# Patient Record
Sex: Female | Born: 1955 | Race: White | Hispanic: No | Marital: Married | Smoking: Never smoker
Health system: Southern US, Community
[De-identification: ages and names within clinical notes are randomized; demographics above are authoritative.]

## PROBLEM LIST (undated history)

## (undated) DIAGNOSIS — Z973 Presence of spectacles and contact lenses: Secondary | ICD-10-CM

## (undated) DIAGNOSIS — K219 Gastro-esophageal reflux disease without esophagitis: Secondary | ICD-10-CM

## (undated) DIAGNOSIS — N9489 Other specified conditions associated with female genital organs and menstrual cycle: Secondary | ICD-10-CM

## (undated) DIAGNOSIS — Z8759 Personal history of other complications of pregnancy, childbirth and the puerperium: Secondary | ICD-10-CM

## (undated) DIAGNOSIS — N950 Postmenopausal bleeding: Secondary | ICD-10-CM

## (undated) DIAGNOSIS — M1990 Unspecified osteoarthritis, unspecified site: Secondary | ICD-10-CM

## (undated) HISTORY — PX: LAPAROSCOPY FOR ECTOPIC PREGNANCY: SUR765

---

## 1997-11-27 ENCOUNTER — Other Ambulatory Visit: Admission: RE | Admit: 1997-11-27 | Discharge: 1997-11-27 | Payer: Self-pay | Admitting: Obstetrics and Gynecology

## 1997-12-13 ENCOUNTER — Ambulatory Visit (HOSPITAL_COMMUNITY): Admission: RE | Admit: 1997-12-13 | Discharge: 1997-12-13 | Payer: Self-pay | Admitting: Obstetrics and Gynecology

## 1999-06-05 ENCOUNTER — Other Ambulatory Visit: Admission: RE | Admit: 1999-06-05 | Discharge: 1999-06-05 | Payer: Self-pay | Admitting: Obstetrics and Gynecology

## 2000-07-20 ENCOUNTER — Other Ambulatory Visit: Admission: RE | Admit: 2000-07-20 | Discharge: 2000-07-20 | Payer: Self-pay | Admitting: Obstetrics and Gynecology

## 2001-08-16 ENCOUNTER — Other Ambulatory Visit: Admission: RE | Admit: 2001-08-16 | Discharge: 2001-08-16 | Payer: Self-pay | Admitting: Obstetrics and Gynecology

## 2001-09-20 ENCOUNTER — Encounter: Admission: RE | Admit: 2001-09-20 | Discharge: 2001-12-19 | Payer: Self-pay | Admitting: Obstetrics and Gynecology

## 2002-11-21 ENCOUNTER — Other Ambulatory Visit: Admission: RE | Admit: 2002-11-21 | Discharge: 2002-11-21 | Payer: Self-pay | Admitting: Obstetrics and Gynecology

## 2003-12-04 ENCOUNTER — Other Ambulatory Visit: Admission: RE | Admit: 2003-12-04 | Discharge: 2003-12-04 | Payer: Self-pay | Admitting: Obstetrics and Gynecology

## 2003-12-13 ENCOUNTER — Encounter: Admission: RE | Admit: 2003-12-13 | Discharge: 2003-12-13 | Payer: Self-pay | Admitting: Obstetrics and Gynecology

## 2005-01-01 ENCOUNTER — Other Ambulatory Visit: Admission: RE | Admit: 2005-01-01 | Discharge: 2005-01-01 | Payer: Self-pay | Admitting: Obstetrics and Gynecology

## 2006-02-09 ENCOUNTER — Encounter: Admission: RE | Admit: 2006-02-09 | Discharge: 2006-02-09 | Payer: Self-pay | Admitting: Obstetrics and Gynecology

## 2006-04-06 HISTORY — PX: COLONOSCOPY: SHX174

## 2007-03-02 ENCOUNTER — Encounter: Admission: RE | Admit: 2007-03-02 | Discharge: 2007-03-02 | Payer: Self-pay | Admitting: Obstetrics and Gynecology

## 2008-03-22 ENCOUNTER — Encounter: Admission: RE | Admit: 2008-03-22 | Discharge: 2008-03-22 | Payer: Self-pay | Admitting: Obstetrics and Gynecology

## 2009-03-22 ENCOUNTER — Encounter: Admission: RE | Admit: 2009-03-22 | Discharge: 2009-03-22 | Payer: Self-pay | Admitting: Obstetrics and Gynecology

## 2010-03-27 ENCOUNTER — Encounter
Admission: RE | Admit: 2010-03-27 | Discharge: 2010-03-27 | Payer: Self-pay | Source: Home / Self Care | Attending: Obstetrics and Gynecology | Admitting: Obstetrics and Gynecology

## 2011-01-09 ENCOUNTER — Other Ambulatory Visit: Payer: Self-pay | Admitting: Obstetrics and Gynecology

## 2011-01-09 DIAGNOSIS — Z1231 Encounter for screening mammogram for malignant neoplasm of breast: Secondary | ICD-10-CM

## 2011-04-02 ENCOUNTER — Ambulatory Visit
Admission: RE | Admit: 2011-04-02 | Discharge: 2011-04-02 | Disposition: A | Discharge status: Home / Self Care | Payer: BC Managed Care – PPO | Source: Ambulatory Visit | Attending: Obstetrics and Gynecology | Admitting: Obstetrics and Gynecology

## 2011-04-02 DIAGNOSIS — Z1231 Encounter for screening mammogram for malignant neoplasm of breast: Secondary | ICD-10-CM

## 2011-04-10 ENCOUNTER — Other Ambulatory Visit: Payer: Self-pay | Admitting: Obstetrics and Gynecology

## 2011-04-10 DIAGNOSIS — R928 Other abnormal and inconclusive findings on diagnostic imaging of breast: Secondary | ICD-10-CM

## 2011-04-13 ENCOUNTER — Ambulatory Visit
Admission: RE | Admit: 2011-04-13 | Discharge: 2011-04-13 | Disposition: A | Discharge status: Home / Self Care | Payer: BC Managed Care – PPO | Source: Ambulatory Visit | Attending: Obstetrics and Gynecology | Admitting: Obstetrics and Gynecology

## 2011-04-13 DIAGNOSIS — R928 Other abnormal and inconclusive findings on diagnostic imaging of breast: Secondary | ICD-10-CM

## 2011-09-16 ENCOUNTER — Other Ambulatory Visit: Payer: Self-pay | Admitting: Obstetrics and Gynecology

## 2011-09-16 DIAGNOSIS — N6009 Solitary cyst of unspecified breast: Secondary | ICD-10-CM

## 2011-10-12 ENCOUNTER — Ambulatory Visit
Admission: RE | Admit: 2011-10-12 | Discharge: 2011-10-12 | Disposition: A | Discharge status: Home / Self Care | Payer: BC Managed Care – PPO | Source: Ambulatory Visit | Attending: Obstetrics and Gynecology | Admitting: Obstetrics and Gynecology

## 2011-10-12 DIAGNOSIS — N6009 Solitary cyst of unspecified breast: Secondary | ICD-10-CM

## 2012-03-21 ENCOUNTER — Other Ambulatory Visit: Payer: Self-pay | Admitting: Obstetrics and Gynecology

## 2012-03-21 DIAGNOSIS — N6310 Unspecified lump in the right breast, unspecified quadrant: Secondary | ICD-10-CM

## 2012-03-21 DIAGNOSIS — Z09 Encounter for follow-up examination after completed treatment for conditions other than malignant neoplasm: Secondary | ICD-10-CM

## 2012-04-11 ENCOUNTER — Ambulatory Visit
Admission: RE | Admit: 2012-04-11 | Discharge: 2012-04-11 | Disposition: A | Discharge status: Home / Self Care | Payer: BC Managed Care – PPO | Source: Ambulatory Visit | Attending: Obstetrics and Gynecology | Admitting: Obstetrics and Gynecology

## 2012-04-11 DIAGNOSIS — N6310 Unspecified lump in the right breast, unspecified quadrant: Secondary | ICD-10-CM

## 2012-04-11 DIAGNOSIS — Z09 Encounter for follow-up examination after completed treatment for conditions other than malignant neoplasm: Secondary | ICD-10-CM

## 2013-03-13 ENCOUNTER — Other Ambulatory Visit: Payer: Self-pay

## 2013-03-13 DIAGNOSIS — Z1231 Encounter for screening mammogram for malignant neoplasm of breast: Secondary | ICD-10-CM

## 2013-04-03 ENCOUNTER — Other Ambulatory Visit: Payer: Self-pay | Admitting: Orthopedic Surgery

## 2013-04-10 ENCOUNTER — Encounter (HOSPITAL_COMMUNITY): Payer: Self-pay | Admitting: Pharmacy Technician

## 2013-04-11 ENCOUNTER — Other Ambulatory Visit (HOSPITAL_COMMUNITY): Payer: Self-pay | Admitting: Orthopedic Surgery

## 2013-04-11 NOTE — Patient Instructions (Addendum)
[address] Axon[address] [facility]  04/11/2013   [facility]:  [facility]19[facility]13  [facility]  [facility] at    1030   AM.  Call this number if you have problems the morning of surgery: [phone]       Remember:   Do not eat food After Midnight. Tuesday NIGHT     / MAY HAVE CLEAR LIQUIDS Monday MORNING UNTIL 0700, THEN NOTHING BY MOUTH   Take these medicines the morning of surgery with A SIP OF WATER:NONE   .  Contacts, dentures or partial plates can not be worn to surgery  Leave suitcase in the car. After surgery it may be brought to your room.  For patients admitted to the hospital, checkout time is 11:00 AM day of  discharge.             SPECIAL INSTRUCTIONS- SEE [place] PREPARING FOR SURGERY INSTRUCTION SHEET-     DO NOT WEAR JEWELRY, LOTIONS, POWDERS, OR PERFUMES.  WOMEN-- DO NOT SHAVE LEGS OR UNDERARMS FOR 12 HOURS BEFORE SHOWERS. MEN MAY SHAVE FACE.  Patients discharged the day of surgery will not be allowed to drive home. IF going home the day of surgery, you must have a driver and someone to stay with you for the first 24 hours  Name and phone number of your driver:       Husband  Dorene SorrowJerry                                                                  Please read over the following fact sheets that you were given:Incentive Spirometry Sheet                                                                                  Marqueta Pulley  PST 336  [phone]                 FAILURE TO FOLLOW THESE INSTRUCTIONS MAY RESULT IN  CANCELLATION   OF YOUR SURGERY                                                  Patient Signature _____________________________

## 2013-04-12 ENCOUNTER — Encounter (HOSPITAL_COMMUNITY): Payer: Self-pay

## 2013-04-12 ENCOUNTER — Encounter (INDEPENDENT_AMBULATORY_CARE_PROVIDER_SITE_OTHER): Payer: Self-pay

## 2013-04-12 ENCOUNTER — Encounter (HOSPITAL_COMMUNITY)
Admission: RE | Admit: 2013-04-12 | Discharge: 2013-04-12 | Disposition: A | Discharge status: Home / Self Care | Payer: BC Managed Care – PPO | Source: Ambulatory Visit | Attending: Orthopedic Surgery | Admitting: Orthopedic Surgery

## 2013-04-12 DIAGNOSIS — Z01818 Encounter for other preprocedural examination: Secondary | ICD-10-CM | POA: Insufficient documentation

## 2013-04-12 DIAGNOSIS — Z01812 Encounter for preprocedural laboratory examination: Secondary | ICD-10-CM | POA: Insufficient documentation

## 2013-04-12 HISTORY — DX: Unspecified osteoarthritis, unspecified site: M19.90

## 2013-04-12 LAB — CBC
HEMATOCRIT: 42.3 % (ref 36.0–46.0)
Hemoglobin: 14.2 g/dL (ref 12.0–15.0)
MCH: 29.5 pg (ref 26.0–34.0)
MCHC: 33.6 g/dL (ref 30.0–36.0)
MCV: 87.9 fL (ref 78.0–100.0)
Platelets: 381 10*3/uL (ref 150–400)
RBC: 4.81 MIL/uL (ref 3.87–5.11)
RDW: 13 % (ref 11.5–15.5)
WBC: 7.4 10*3/uL (ref 4.0–10.5)

## 2013-04-12 LAB — HCG, SERUM, QUALITATIVE: Preg, Serum: NEGATIVE

## 2013-04-18 ENCOUNTER — Ambulatory Visit
Admission: RE | Admit: 2013-04-18 | Discharge: 2013-04-18 | Disposition: A | Discharge status: Home / Self Care | Payer: BC Managed Care – PPO | Source: Ambulatory Visit

## 2013-04-18 DIAGNOSIS — Z1231 Encounter for screening mammogram for malignant neoplasm of breast: Secondary | ICD-10-CM

## 2013-04-19 ENCOUNTER — Encounter (HOSPITAL_COMMUNITY): Payer: BC Managed Care – PPO | Admitting: Anesthesiology

## 2013-04-19 ENCOUNTER — Encounter (HOSPITAL_COMMUNITY)
Admission: RE | Disposition: A | Discharge status: Home / Self Care | Payer: Self-pay | Source: Ambulatory Visit | Attending: Orthopedic Surgery

## 2013-04-19 ENCOUNTER — Ambulatory Visit (HOSPITAL_COMMUNITY): Payer: BC Managed Care – PPO | Admitting: Anesthesiology

## 2013-04-19 ENCOUNTER — Ambulatory Visit (HOSPITAL_COMMUNITY)
Admission: RE | Admit: 2013-04-19 | Discharge: 2013-04-19 | Disposition: A | Discharge status: Home / Self Care | Payer: BC Managed Care – PPO | Source: Ambulatory Visit | Attending: Orthopedic Surgery | Admitting: Orthopedic Surgery

## 2013-04-19 ENCOUNTER — Encounter (HOSPITAL_COMMUNITY): Payer: Self-pay | Admitting: *Deleted

## 2013-04-19 DIAGNOSIS — IMO0002 Reserved for concepts with insufficient information to code with codable children: Secondary | ICD-10-CM | POA: Insufficient documentation

## 2013-04-19 DIAGNOSIS — M2240 Chondromalacia patellae, unspecified knee: Secondary | ICD-10-CM | POA: Insufficient documentation

## 2013-04-19 DIAGNOSIS — X58XXXA Exposure to other specified factors, initial encounter: Secondary | ICD-10-CM | POA: Insufficient documentation

## 2013-04-19 DIAGNOSIS — S83249A Other tear of medial meniscus, current injury, unspecified knee, initial encounter: Secondary | ICD-10-CM | POA: Diagnosis present

## 2013-04-19 HISTORY — PX: KNEE ARTHROSCOPY: SHX127

## 2013-04-19 SURGERY — ARTHROSCOPY, KNEE
Anesthesia: General | Site: Knee | Laterality: Left

## 2013-04-19 MED ORDER — PROPOFOL 10 MG/ML IV BOLUS
INTRAVENOUS | Status: AC
  Filled 2013-04-19: qty 20

## 2013-04-19 MED ORDER — CHLORHEXIDINE GLUCONATE 4 % EX LIQD: 60.0000 mL | Freq: Once | CUTANEOUS | Status: DC

## 2013-04-19 MED ORDER — ONDANSETRON HCL 4 MG/2ML IJ SOLN
INTRAMUSCULAR | Status: AC
  Filled 2013-04-19: qty 2

## 2013-04-19 MED ORDER — MEPERIDINE HCL 50 MG/ML IJ SOLN: 6.2500 mg | INTRAMUSCULAR | Status: DC | PRN

## 2013-04-19 MED ORDER — OXYCODONE HCL 5 MG PO TABS: 5.0000 mg | ORAL_TABLET | Freq: Once | ORAL | Status: DC | PRN

## 2013-04-19 MED ORDER — PROPOFOL 10 MG/ML IV BOLUS
INTRAVENOUS | Status: DC | PRN
  Administered 2013-04-19: 160 mg via INTRAVENOUS

## 2013-04-19 MED ORDER — METHOCARBAMOL 500 MG PO TABS: 500.0000 mg | ORAL_TABLET | Freq: Four times a day (QID) | ORAL | Status: DC

## 2013-04-19 MED ORDER — CEFAZOLIN SODIUM-DEXTROSE 2-3 GM-% IV SOLR
2.0000 g | INTRAVENOUS | Status: AC
  Administered 2013-04-19: 2 g via INTRAVENOUS

## 2013-04-19 MED ORDER — FENTANYL CITRATE 0.05 MG/ML IJ SOLN
INTRAMUSCULAR | Status: AC
  Filled 2013-04-19: qty 2

## 2013-04-19 MED ORDER — ONDANSETRON HCL 4 MG/2ML IJ SOLN
INTRAMUSCULAR | Status: DC | PRN
  Administered 2013-04-19: 4 mg via INTRAVENOUS

## 2013-04-19 MED ORDER — FENTANYL CITRATE 0.05 MG/ML IJ SOLN
INTRAMUSCULAR | Status: DC | PRN
  Administered 2013-04-19: 25 ug via INTRAVENOUS
  Administered 2013-04-19 (×2): 50 ug via INTRAVENOUS
  Administered 2013-04-19: 25 ug via INTRAVENOUS

## 2013-04-19 MED ORDER — HYDROMORPHONE HCL PF 1 MG/ML IJ SOLN: 0.2500 mg | INTRAMUSCULAR | Status: DC | PRN

## 2013-04-19 MED ORDER — HYDROCODONE-ACETAMINOPHEN 5-325 MG PO TABS: 1.0000 | ORAL_TABLET | Freq: Four times a day (QID) | ORAL | Status: DC | PRN

## 2013-04-19 MED ORDER — BUPIVACAINE-EPINEPHRINE 0.25% -1:200000 IJ SOLN
INTRAMUSCULAR | Status: DC | PRN
  Administered 2013-04-19: 20 mL

## 2013-04-19 MED ORDER — ACETAMINOPHEN 10 MG/ML IV SOLN
1000.0000 mg | Freq: Once | INTRAVENOUS | Status: AC
  Administered 2013-04-19: 1000 mg via INTRAVENOUS
  Filled 2013-04-19: qty 100

## 2013-04-19 MED ORDER — LACTATED RINGERS IV SOLN
INTRAVENOUS | Status: DC | PRN
  Administered 2013-04-19: 12:00:00 via INTRAVENOUS

## 2013-04-19 MED ORDER — MIDAZOLAM HCL 5 MG/5ML IJ SOLN
INTRAMUSCULAR | Status: DC | PRN
  Administered 2013-04-19: 2 mg via INTRAVENOUS

## 2013-04-19 MED ORDER — MIDAZOLAM HCL 2 MG/2ML IJ SOLN
INTRAMUSCULAR | Status: AC
  Filled 2013-04-19: qty 2

## 2013-04-19 MED ORDER — LACTATED RINGERS IR SOLN
Status: DC | PRN
  Administered 2013-04-19: 6000 mL

## 2013-04-19 MED ORDER — DEXAMETHASONE SODIUM PHOSPHATE 10 MG/ML IJ SOLN: 10.0000 mg | Freq: Once | INTRAMUSCULAR | Status: DC

## 2013-04-19 MED ORDER — OXYCODONE HCL 5 MG/5ML PO SOLN
5.0000 mg | Freq: Once | ORAL | Status: DC | PRN
  Filled 2013-04-19: qty 5

## 2013-04-19 MED ORDER — DEXAMETHASONE SODIUM PHOSPHATE 10 MG/ML IJ SOLN
INTRAMUSCULAR | Status: DC | PRN
  Administered 2013-04-19: 10 mg via INTRAVENOUS

## 2013-04-19 MED ORDER — BUPIVACAINE-EPINEPHRINE 0.25% -1:200000 IJ SOLN
INTRAMUSCULAR | Status: AC
  Filled 2013-04-19: qty 1

## 2013-04-19 MED ORDER — CEFAZOLIN SODIUM-DEXTROSE 2-3 GM-% IV SOLR
INTRAVENOUS | Status: AC
  Filled 2013-04-19: qty 50

## 2013-04-19 MED ORDER — LIDOCAINE HCL (CARDIAC) 20 MG/ML IV SOLN
INTRAVENOUS | Status: DC | PRN
  Administered 2013-04-19: 60 mg via INTRAVENOUS

## 2013-04-19 MED ORDER — SODIUM CHLORIDE 0.9 % IV SOLN: INTRAVENOUS | Status: DC

## 2013-04-19 MED ORDER — LIDOCAINE HCL (CARDIAC) 20 MG/ML IV SOLN
INTRAVENOUS | Status: AC
  Filled 2013-04-19: qty 5

## 2013-04-19 MED ORDER — PROMETHAZINE HCL 25 MG/ML IJ SOLN: 6.2500 mg | INTRAMUSCULAR | Status: DC | PRN

## 2013-04-19 SURGICAL SUPPLY — 28 items
BANDAGE ELASTIC 6 VELCRO ST LF (GAUZE/BANDAGES/DRESSINGS) ×2 IMPLANT
BLADE 4.2CUDA (BLADE) ×3 IMPLANT
CLOTH BEACON ORANGE TIMEOUT ST (SAFETY) ×3 IMPLANT
COUNTER NEEDLE 20 DBL MAG RED (NEEDLE) ×3 IMPLANT
CUFF TOURN SGL QUICK 34 (TOURNIQUET CUFF) ×3
CUFF TRNQT CYL 34X4X40X1 (TOURNIQUET CUFF) ×1 IMPLANT
DRSG EMULSION OIL 3X3 NADH (GAUZE/BANDAGES/DRESSINGS) ×3 IMPLANT
DURAPREP 26ML APPLICATOR (WOUND CARE) ×3 IMPLANT
GLOVE BIO SURGEON STRL SZ7 (GLOVE) ×4 IMPLANT
GLOVE BIO SURGEON STRL SZ8 (GLOVE) ×3 IMPLANT
GLOVE BIOGEL PI IND STRL 7.0 (GLOVE) IMPLANT
GLOVE BIOGEL PI IND STRL 8 (GLOVE) ×1 IMPLANT
GLOVE BIOGEL PI INDICATOR 7.0 (GLOVE) ×2
GLOVE BIOGEL PI INDICATOR 8 (GLOVE) ×2
GOWN STRL REUS W/TWL LRG LVL3 (GOWN DISPOSABLE) ×5 IMPLANT
IV NS 1000ML (IV SOLUTION) ×18
IV NS 1000ML BAXH (IV SOLUTION) IMPLANT
MANIFOLD NEPTUNE II (INSTRUMENTS) ×3 IMPLANT
PACK ARTHROSCOPY WL (CUSTOM PROCEDURE TRAY) ×3 IMPLANT
PADDING CAST COTTON 6X4 STRL (CAST SUPPLIES) ×4 IMPLANT
POSITIONER SURGICAL ARM (MISCELLANEOUS) ×3 IMPLANT
SET ARTHROSCOPY TUBING (MISCELLANEOUS) ×3
SET ARTHROSCOPY TUBING LN (MISCELLANEOUS) ×1 IMPLANT
SPONGE GAUZE 4X4 12PLY (GAUZE/BANDAGES/DRESSINGS) ×2 IMPLANT
SUT ETHILON 4 0 PS 2 18 (SUTURE) ×3 IMPLANT
TOWEL OR 17X26 10 PK STRL BLUE (TOWEL DISPOSABLE) ×3 IMPLANT
WAND 90 DEG TURBOVAC W/CORD (SURGICAL WAND) ×4 IMPLANT
WRAP KNEE MAXI GEL POST OP (GAUZE/BANDAGES/DRESSINGS) ×3 IMPLANT

## 2013-04-19 NOTE — H&P (Signed)
  CC- Brittney Hatfield is a 58 y.o. female who presents with left knee pain.  HPI- . Knee Pain: Patient presents with knee pain involving the  left knee. Onset of the symptoms was several months ago. Inciting event: increased pain after speed walking. Current symptoms include giving out, pain located medially and stiffness. Pain is aggravated by lateral movements, pivoting, rising after sitting and squatting.  Patient has had no prior knee problems. Evaluation to date: MRI: abnormal medial meniscal tear. Treatment to date: rest and cortisone injection which did not provide significant relief.  Past Medical History  Diagnosis Date  . Arthritis     Past Surgical History  Procedure Laterality Date  . Cesarean section    . Colonoscopy      Prior to Admission medications   Medication Sig Start Date End Date Taking? Authorizing Provider  B Complex-C (B-COMPLEX WITH VITAMIN C) tablet Take 1 tablet by mouth daily.    Historical Provider, MD  Biotin 5000 MCG CAPS Take 1 capsule by mouth daily.    Historical Provider, MD  calcium-vitamin D (OSCAL WITH D) 500-200 MG-UNIT per tablet Take 1 tablet by mouth daily with breakfast.    Historical Provider, MD  cholecalciferol (VITAMIN D) 1000 UNITS tablet Take 1,000 Units by mouth daily.    Historical Provider, MD  GLUCOSAMINE HCL PO Take 2,000 mg by mouth daily.    Historical Provider, MD  Multiple Vitamin (MULTIVITAMIN WITH MINERALS) TABS tablet Take 1 tablet by mouth daily.    Historical Provider, MD  Omega-3 Fatty Acids (OMEGA 3 PO) Take 1 capsule by mouth daily.    Historical Provider, MD  OVER THE COUNTER MEDICATION Take 1 tablet by mouth daily. Tumeric    Historical Provider, MD  vitamin C (ASCORBIC ACID) 500 MG tablet Take 500 mg by mouth daily.    Historical Provider, MD   Knee Exam antalgic gait, soft tissue tenderness over medial joint line, no effusion, negative pivot-shift, collateral ligaments intact  Physical Examination: General appearance  - alert, well appearing, and in no distress Mental status - alert, oriented to person, place, and time Chest - clear to auscultation, no wheezes, rales or rhonchi, symmetric air entry Heart - normal rate, regular rhythm, normal S1, S2, no murmurs, rubs, clicks or gallops Abdomen - soft, nontender, nondistended, no masses or organomegaly Neurological - alert, oriented, normal speech, no focal findings or movement disorder noted   Asessment/Plan--- Left knee medial meniscal tear- - Plan left knee arthroscopy with meniscal debridement. Procedure risks and potential comps discussed with patient who elects to proceed. Goals are decreased pain and increased function with a high likelihood of achieving both

## 2013-04-19 NOTE — Discharge Instructions (Signed)
Arthroscopic Procedure, Knee °An arthroscopic procedure can find what is wrong with your knee. °PROCEDURE °Arthroscopy is a surgical technique that allows your orthopedic surgeon to diagnose and treat your knee injury with accuracy. They will look into your knee through a small instrument. This is almost like a small (pencil sized) telescope. Because arthroscopy affects your knee less than open knee surgery, you can anticipate a more rapid recovery. Taking an active role by following your caregiver's instructions will help with rapid and complete recovery. Use crutches, rest, elevation, ice, and knee exercises as instructed. The length of recovery depends on various factors including type of injury, age, physical condition, medical conditions, and your rehabilitation. °Your knee is the joint between the large bones (femur and tibia) in your leg. Cartilage covers these bone ends which are smooth and slippery and allow your knee to bend and move smoothly. Two menisci, thick, semi-lunar shaped pads of cartilage which form a rim inside the joint, help absorb shock and stabilize your knee. Ligaments bind the bones together and support your knee joint. Muscles move the joint, help support your knee, and take stress off the joint itself. Because of this all programs and physical therapy to rehabilitate an injured or repaired knee require rebuilding and strengthening your muscles. °AFTER THE PROCEDURE °· After the procedure, you will be moved to a recovery area until most of the effects of the medication have worn off. Your caregiver will discuss the test results with you.  °· Only take over-the-counter or prescription medicines for pain, discomfort, or fever as directed by your caregiver.  °SEEK MEDICAL CARE IF:  °· You have increased bleeding from your wounds.  °· You see redness, swelling, or have increasing pain in your wounds.  °· You have pus coming from your wound.  °· You have an oral temperature above 102° F (38.9°  C).  °· You notice a bad smell coming from the wound or dressing.  °· You have severe pain with any motion of your knee.  °SEEK IMMEDIATE MEDICAL CARE IF:  °· You develop a rash.  °· You have difficulty breathing.  °· You have any allergic problems.  °FURTHER INSTRUCTIONS: °· You may start showering two days after being discharged home but do not submerge the incisions under water.  °· Change dressing 48 hours after the procedure and then cover the small incisions with band aids until your follow up visit. °· Avoid periods of inactivity such as sitting longer than an hour when not asleep. This helps prevent blood clots.  °· You may put full weight on your legs and walk as much as is comfortable.  °· Do not drive while taking narcotics.  °Wear the elastic stockings for three weeks following surgery during the day but you may remove then at night. °· Make sure you keep all of your appointments after your operation with all of your doctors and caregivers. You should call the office at [phone] and make an appointment for approximately one week after the date of your surgery. °· Please pick up a stool softener and laxative for home use as long as you are requiring pain medications. °· Continue to use ice on the knee for pain and swelling from surgery. You may notice swelling that will progress down to the foot and ankle.  This is normal after surgery.  Elevate the leg when you are not up walking on it.   °RANGE OF MOTION AND STRENGTHENING EXERCISES  °Rehabilitation of the knee is   important following a knee injury or an operation. After just a few days of immobilization, the muscles of the thigh which control the knee become weakened and shrink (atrophy). Knee exercises are designed to build up the tone and strength of the thigh muscles and to improve knee motion. Often times heat used for twenty to thirty minutes before working out will loosen up your tissues and help with improving the range of motion but do not  use heat for the first two weeks following surgery. These exercises can be done on a training (exercise) mat, on the floor, on a table or on a bed. Use what ever works the best and is most comfortable for you Knee exercises include: ° ° ° ° ° ° °QUAD STRENGTHENING EXERCISES °Strengthening Quadriceps Sets ° °Tighten muscles on top of thigh by pushing knees down into floor or table. °Hold for 20 seconds. Repeat 10 times. °Do 2 sessions per day. ° ° ° °Strengthening Terminal Knee Extension ° °With knee bent over bolster, straighten knee by tightening muscle on top of thigh. Be sure to keep bottom of knee on bolster. °Hold for 20 seconds. Repeat 10 times. °Do 2 sessions per day. ° ° °Straight Leg with Bent Knee ° °Lie on back with opposite leg bent. Keep involved knee slightly bent at knee and raise leg 4-6". Hold for 10 seconds. °Repeat 20 times per set. °Do 2 sets per session. °Do 2 sessions per day. ° °

## 2013-04-19 NOTE — Transfer of Care (Signed)
Immediate Anesthesia Transfer of Care Note  Patient: Brittney PufferDiana R Hatfield  Procedure(s) Performed: Procedure(s): LEFT KNEE ARTHROSCOPY, meniscal debridement, chondroplasty (Left)  Patient Location: PACU  Anesthesia Type:General  Level of Consciousness: awake, alert  and oriented  Airway & Oxygen Therapy: Patient Spontanous Breathing and Patient connected to face mask oxygen  Post-op Assessment: Report given to PACU RN and Post -op Vital signs reviewed and stable  Post vital signs: Reviewed and stable  Complications: No apparent anesthesia complications

## 2013-04-19 NOTE — Anesthesia Postprocedure Evaluation (Signed)
  Anesthesia Post-op Note  Patient: Brittney PufferDiana R Hatfield  Procedure(s) Performed: Procedure(s) (LRB): LEFT KNEE ARTHROSCOPY, meniscal debridement, chondroplasty (Left)  Patient Location: PACU  Anesthesia Type: General  Level of Consciousness: awake and alert   Airway and Oxygen Therapy: Patient Spontanous Breathing  Post-op Pain: mild  Post-op Assessment: Post-op Vital signs reviewed, Patient's Cardiovascular Status Stable, Respiratory Function Stable, Patent Airway and No signs of Nausea or vomiting  Last Vitals:  Filed Vitals:   04/19/13 1529  BP: 114/72  Pulse: 84  Temp: 36.1 C  Resp: 20    Post-op Vital Signs: stable   Complications: No apparent anesthesia complications

## 2013-04-19 NOTE — Op Note (Signed)
Preoperative diagnosis-  Left knee medial meniscal tear  Postoperative diagnosis Left- knee medial meniscal tear  Plus Left medial femoral chondral defect  Procedure- Left knee arthroscopy with medial meniscal debridement and chondroplasty   Surgeon- Gus RankinFrank V. Linken Mcglothen, MD  Anesthesia-General  EBL-  minimal Complications- None  Condition- PACU - hemodynamically stable.  Brief clinical note- -Brittney Hatfield is a 58 y.o.  female with a several month history of left knee pain and mechanical symptoms. Exam and history suggested medial meniscal tear confirmed by MRI. The patient presents now for arthroscopy and debridement   Procedure in detail -       After successful administration of General anesthetic, a tourmiquet is placed high on the Left  thigh and the Left lower extremity is prepped and draped in the usual sterile fashion. Time out is performed by the surgical team. Standard superomedial and inferolateral portal sites are marked and incisions made with an 11 blade. The inflow cannula is passed through the superomedial portal and camera through the inferolateral portal and inflow is initiated. Arthroscopic visualization proceeds.      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 The medial and lateral gutters are visualized and there are  no loose bodies. Flexion and valgus force is applied to the knee and the medial compartment is entered. A spinal needle is passed into the joint through the site marked for the inferomedial portal. A small incision is made and the dilator passed into the joint. The findings for the medial compartment are tear of posterior horn medial meniscus and grade II and II chondromalacia medial femoral condyle and 1 x 1 cm Grade IV area medial tibial plateau. The tear is debrided to a stable base with baskets and a shaver and  sealed off with the Arthrocare. The shaver is used to debride the unstable cartilage on the medial femoral condyle to a stable cartilaginous  base with stable edges. It is probed and found to be stable.    The intercondylar notch is visualized and the ACL appears normal. The lateral compartment is entered and the findings are normal .       The joint is again inspected and there are no other tears, defects or loose bodies identified. The arthroscopic equipment is then removed from the inferior portals which are closed with interrupted 4-0 nylon. 20 ml of .25% Marcaine with epinephrine are injected through the inflow cannula and the cannula is then removed and the portal closed with nylon. The incisions are cleaned and dried and a bulky sterile dressing is applied. The patient is then awakened and transported to recovery in stable condition.   04/19/2013, 1:50 PM

## 2013-04-19 NOTE — Preoperative (Signed)
Beta Blockers   Reason not to administer Beta Blockers:Not Applicable 

## 2013-04-19 NOTE — Anesthesia Preprocedure Evaluation (Addendum)
Anesthesia Evaluation  Patient identified by MRN, date of birth, ID band Patient awake    Reviewed: Allergy & Precautions, H&P , NPO status , Patient's Chart, lab work & pertinent test results  Airway Mallampati: II TM Distance: >3 FB Neck ROM: Full    Dental no notable dental hx.    Pulmonary neg pulmonary ROS,  breath sounds clear to auscultation  Pulmonary exam normal       Cardiovascular negative cardio ROS  Rhythm:Regular Rate:Normal     Neuro/Psych negative neurological ROS  negative psych ROS   GI/Hepatic negative GI ROS, Neg liver ROS,   Endo/Other  negative endocrine ROS  Renal/GU negative Renal ROS     Musculoskeletal negative musculoskeletal ROS (+)   Abdominal   Peds  Hematology negative hematology ROS (+)   Anesthesia Other Findings   Reproductive/Obstetrics negative OB ROS                           Anesthesia Physical Anesthesia Plan  ASA: II  Anesthesia Plan: General   Post-op Pain Management:    Induction: Intravenous  Airway Management Planned: LMA  Additional Equipment:   Intra-op Plan:   Post-operative Plan: Extubation in OR  Informed Consent: I have reviewed the patients History and Physical, chart, labs and discussed the procedure including the risks, benefits and alternatives for the proposed anesthesia with the patient or authorized representative who has indicated his/her understanding and acceptance.   Dental advisory given  Plan Discussed with: CRNA  Anesthesia Plan Comments:         Anesthesia Quick Evaluation

## 2013-04-20 ENCOUNTER — Encounter (HOSPITAL_COMMUNITY): Payer: Self-pay | Admitting: Orthopedic Surgery

## 2014-03-14 ENCOUNTER — Other Ambulatory Visit: Payer: Self-pay

## 2014-03-14 DIAGNOSIS — Z1231 Encounter for screening mammogram for malignant neoplasm of breast: Secondary | ICD-10-CM

## 2014-04-19 ENCOUNTER — Ambulatory Visit
Admission: RE | Admit: 2014-04-19 | Discharge: 2014-04-19 | Disposition: A | Discharge status: Home / Self Care | Payer: BC Managed Care – PPO | Source: Ambulatory Visit

## 2014-04-19 DIAGNOSIS — Z1231 Encounter for screening mammogram for malignant neoplasm of breast: Secondary | ICD-10-CM

## 2014-07-25 ENCOUNTER — Ambulatory Visit (INDEPENDENT_AMBULATORY_CARE_PROVIDER_SITE_OTHER): Payer: BLUE CROSS/BLUE SHIELD | Admitting: Podiatrist

## 2014-07-25 ENCOUNTER — Encounter: Payer: Self-pay | Admitting: Podiatrist

## 2014-07-25 ENCOUNTER — Ambulatory Visit (INDEPENDENT_AMBULATORY_CARE_PROVIDER_SITE_OTHER): Payer: BLUE CROSS/BLUE SHIELD

## 2014-07-25 VITALS — BP 135/73 | HR 68 | Resp 11 | Ht 64.0 in | Wt 180.0 lb

## 2014-07-25 DIAGNOSIS — M2040 Other hammer toe(s) (acquired), unspecified foot: Secondary | ICD-10-CM

## 2014-07-25 DIAGNOSIS — M79676 Pain in unspecified toe(s): Secondary | ICD-10-CM | POA: Diagnosis not present

## 2014-07-25 DIAGNOSIS — M2010 Hallux valgus (acquired), unspecified foot: Secondary | ICD-10-CM | POA: Diagnosis not present

## 2014-07-25 DIAGNOSIS — M216X9 Other acquired deformities of unspecified foot: Secondary | ICD-10-CM | POA: Diagnosis not present

## 2014-07-25 NOTE — Patient Instructions (Signed)
Hammer Toes Hammer toes is a condition in which one or more of your toes is permanently flexed. CAUSES  This happens when a muscle imbalance or abnormal bone length makes your small toes buckle. This causes the toe joint to contract and the strong cord-like bands that attach muscles to the bones (tendons) in your toes to shorten.  SIGNS AND SYMPTOMS  Common symptoms of flexible hammer toes include:   A buildup of skin cells (corns). Corns occur where boney bumps come in frequent contact with hard surfaces. For example, where your shoes press and rub.  Irritation.  Inflammation.  Pain.  Limited motion in your toes. DIAGNOSIS  Hammer toes are diagnosed through a physical exam of your toes. During the exam, your health care provider may try to reproduce your symptoms by manipulating your foot. Often, X-ray exams are done to determine the degree of deformity and to make sure that the cause is not a fracture.  TREATMENT  Hammer toes can be treated with corrective surgery. There are several types of surgical procedures that can treat hammer toes. The most common procedures include:  Arthroplasty--A portion of the joint is surgically removed and your toe is straightened. The gap fills in with fibrous tissue. This procedure helps treat pain and deformity and helps restore function.  Fusion--Cartilage between the two bones of the affected joint is taken out and the bones fuse together into one longer bone. This helps keep your toe stable and reduces pain but leaves your toe stiff, yet straight.  Implantation--A portion of your bone is removed and replaced with an implant to restore motion.  Flexor tendon transfers--This procedure repositions the tendons that curl the toes down (flexor tendons). This may be done to release the deforming force that causes your toe to buckle. Several of these procedures require fixing your toe with a pin that is visible at the tip of your toe. The pin keeps the toe  straight during healing. Your health care provider will remove the pin usually within 4-8 weeks after the procedure.  Document Released: 03/20/2000 Document Revised: 03/28/2013 Document Reviewed: 11/28/2012 [facility] Patient Information 2015 [place], [place]. This information is not intended to replace advice given to you by your health care provider. Make sure you discuss any questions you have with your health care provider.   Bunionectomy A bunionectomy is surgery to remove a bunion. A bunion is an enlargement of the joint at the base of the big toe. It is made up of bone and soft tissue on the inside part of the joint. Over time, a painful lump appears on the inside of the joint. The big toe begins to point inward toward the second toe. New bone growth can occur and a bone spur may form. The pain eventually causes difficulty walking. A bunion usually results from inflammation caused by the irritation of poorly fitting shoes. It often begins later in life. A bunionectomy is performed when nonsurgical treatment no longer works. When surgery is needed, the extent of the procedure will depend on the degree of deformity of the foot. Your surgeon will discuss with you the different procedures and what will work best for you depending on your age and health. LET YOUR CAREGIVER KNOW ABOUT:   Previous problems with anesthetics or medicines used to numb the skin.  Allergies to dyes, iodine, foods, and/or latex.  Medicines taken including herbs, eye drops, prescription medicines (especially medicines used to "thin the blood"), aspirin and other over-the-counter medicines, and steroids (by mouth  or as a cream).  History of bleeding or blood problems.  Possibility of pregnancy, if this applies.  History of blood clots in your legs and/or lungs .  Previous surgery.  Other important health problems. RISKS AND COMPLICATIONS   Infection.  Pain.  Nerve damage.  Possibility that the bunion will  recur. BEFORE THE PROCEDURE  You should be present 60 minutes prior to your procedure or as directed.  PROCEDURE  Surgery is often done so that you can go home the same day (outpatient). It may be done in a hospital or in an outpatient surgical center. An anesthetic will be used to help you sleep during the procedure. Sometimes, a spinal anesthetic is used to make you numb below the waist. A cut (incision) is made over the swollen area at the first joint of the big toe. The enlarged lump will be removed. If there is a need to reposition the bones of the big toe, this may require more than 1 incision. The bone itself may need to be cut. Screws and wires may be used in the repair. These can be removed at a later date. In severe cases, the entire joint may need to be removed and a joint replacement inserted. When done, the incision is closed with stitches (sutures). Skin adhesive strips may be added for reinforcement. They help hold the incision closed.  AFTER THE PROCEDURE  Compression bandages (dressings) are then wrapped around the wound. This helps to keep the foot in alignment and reduce swelling. Your foot will be monitored for bleeding and swelling. You will need to stay for a few hours in the recovery area before being discharged. This allows time for the anesthesia to wear off. You will be discharged home when you are awake, stable, and doing well. HOME CARE INSTRUCTIONS   You can expect to return to normal activities within 6 to 8 weeks after surgery. The foot is at increased risk for swelling for several months. When you can expect to bear weight on the operated foot will depend on the extent of your surgery. The milder the deformity, the less tissue is removed and the sooner the return to normal activity level. During the recovery period, a special shoe, boot, or cast may be worn to accommodate the surgical bandage and to help provide stability to the foot.  Once you are home, an ice pack applied  to the operative site may help with discomfort and keep swelling down. Stop using the ice if it causes discomfort.  Keep your feet raised (elevated) when possible to lessen swelling.  If you have an elastic bandage on your foot and you have numbness, tingling, or your foot becomes cold and blue, adjust the bandage to make it comfortable.  Change dressings as directed.  Keep the wound dry and clean. The wound may be washed gently with soap and water. Gently blot dry without rubbing. Do not take baths or use swimming pools or hot tubs for 10 days, or as instructed by your caregiver.  Only take over-the-counter or prescription medicines for pain, discomfort, or fever as directed by your caregiver.  You may continue a normal diet as directed.  For activity, use crutches with no weight bearing or your orthopedic shoe as directed. Continue to use crutches or a cane as directed until you can stand without causing pain. SEEK MEDICAL CARE IF:   You have redness, swelling, bruising, or increasing pain in the wound.  There is pus coming from the wound.  You have drainage from a wound lasting longer than 1 day.  You have an oral temperature above 102 F (38.9 C).  You notice a bad smell coming from the wound or dressing.  The wound breaks open after sutures have been removed.  You develop dizzy episodes or fainting while standing.  You have persistent nausea or vomiting.  Your toes become cold.  Pain is not relieved with medicines. SEEK IMMEDIATE MEDICAL CARE IF:   You develop a rash.  You have difficulty breathing.  You develop any reaction or side effects to medicines given.  Your toes are numb or blue, or you have severe pain. MAKE SURE YOU:   Understand these instructions.  Will watch your condition.  Will get help right away if you are not doing well or get worse. Document Released: 03/06/2005 Document Revised: 06/15/2011 Document Reviewed: 04/11/2007 [facility] Patient  Information 2015 [place], [place]. This information is not intended to replace advice given to you by your health care provider. Make sure you discuss any questions you have with your health care provider.

## 2014-07-25 NOTE — Progress Notes (Signed)
   Subjective:    Patient ID: Brittney Hatfield, female    DOB: 08-21-1955, 59 y.o.   MRN: [id number]  HPI    Review of Systems  Musculoskeletal: Positive for gait problem.       Pt states she currently has bursitis in her right hip.  All other systems reviewed and are negative.      Objective:   Physical Exam Patient is awake, alert, and oriented x 3.  In no acute distress.  Vascular status is intact with palpable pedal pulses at 2/4 DP and PT bilateral and capillary refill time within normal limits. Neurological sensation is also intact bilaterally via Semmes Weinstein monofilament at 5/5 sites. Light touch, vibratory sensation, Achilles tendon reflex is intact. Dermatological exam reveals skin color, turger and texture as normal. No open lesions present.  Musculature intact with dorsiflexion, plantarflexion, inversion, eversion.  Significant hallux abductovalgus deformity with lateral deviation of the hallux is seen bilateral. Dislocation of the second metatarsophalangeal joint with contracture hammertoe deformity and long second metatarsal is also seen right. Hallux abductovalgus deformity with elongated second metatarsal and Mild hammertoe contractures is also seen left. X-rays show consistent findings with the clinical evaluation. No sign of fracture or dislocation seen. Patient relates some discomfort on the lateral aspect of the right foot at the fifth metatarsal head however this is likely due to the width of the foot due to the bunion prominence. She relates minimal pain to either foot.     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    Plan: Discussed conservative versus surgical treatments. Discussed wider shoes and sandals etc. Also discussed surgical options including a bunionectomy with shortening second metatarsal osteotomy in hammertoe repair. Discussed the aftercare portion and discussed that she would need to at least take a week off of work. 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 Also discussed that likely she would need pin on the second toe that would stay in for 5 weeks and then she'll have to consider this for driving. The patient and her husband will consider their options and will call if interested in surgery.

## 2015-03-07 ENCOUNTER — Other Ambulatory Visit: Payer: Self-pay

## 2015-03-07 DIAGNOSIS — Z1231 Encounter for screening mammogram for malignant neoplasm of breast: Secondary | ICD-10-CM

## 2015-04-22 ENCOUNTER — Ambulatory Visit: Payer: BLUE CROSS/BLUE SHIELD

## 2015-05-13 ENCOUNTER — Ambulatory Visit
Admission: RE | Admit: 2015-05-13 | Discharge: 2015-05-13 | Disposition: A | Discharge status: Home / Self Care | Payer: BLUE CROSS/BLUE SHIELD | Source: Ambulatory Visit

## 2015-05-13 DIAGNOSIS — Z1231 Encounter for screening mammogram for malignant neoplasm of breast: Secondary | ICD-10-CM

## 2015-12-25 DIAGNOSIS — L84 Corns and callosities: Secondary | ICD-10-CM | POA: Diagnosis not present

## 2015-12-25 DIAGNOSIS — D1801 Hemangioma of skin and subcutaneous tissue: Secondary | ICD-10-CM | POA: Diagnosis not present

## 2015-12-25 DIAGNOSIS — D2261 Melanocytic nevi of right upper limb, including shoulder: Secondary | ICD-10-CM | POA: Diagnosis not present

## 2015-12-25 DIAGNOSIS — L814 Other melanin hyperpigmentation: Secondary | ICD-10-CM | POA: Diagnosis not present

## 2015-12-25 DIAGNOSIS — D2262 Melanocytic nevi of left upper limb, including shoulder: Secondary | ICD-10-CM | POA: Diagnosis not present

## 2016-01-14 DIAGNOSIS — M859 Disorder of bone density and structure, unspecified: Secondary | ICD-10-CM | POA: Diagnosis not present

## 2016-01-14 DIAGNOSIS — Z0000 Encounter for general adult medical examination without abnormal findings: Secondary | ICD-10-CM | POA: Diagnosis not present

## 2016-01-21 DIAGNOSIS — M8580 Other specified disorders of bone density and structure, unspecified site: Secondary | ICD-10-CM | POA: Diagnosis not present

## 2016-01-21 DIAGNOSIS — Z0000 Encounter for general adult medical examination without abnormal findings: Secondary | ICD-10-CM | POA: Diagnosis not present

## 2016-01-21 DIAGNOSIS — K219 Gastro-esophageal reflux disease without esophagitis: Secondary | ICD-10-CM | POA: Diagnosis not present

## 2016-01-21 DIAGNOSIS — E784 Other hyperlipidemia: Secondary | ICD-10-CM | POA: Diagnosis not present

## 2016-01-21 DIAGNOSIS — E669 Obesity, unspecified: Secondary | ICD-10-CM | POA: Diagnosis not present

## 2016-01-21 DIAGNOSIS — Z1389 Encounter for screening for other disorder: Secondary | ICD-10-CM | POA: Diagnosis not present

## 2016-01-24 DIAGNOSIS — Z1212 Encounter for screening for malignant neoplasm of rectum: Secondary | ICD-10-CM | POA: Diagnosis not present

## 2016-05-04 ENCOUNTER — Other Ambulatory Visit: Payer: Self-pay | Admitting: Obstetrics and Gynecology

## 2016-05-04 DIAGNOSIS — Z1231 Encounter for screening mammogram for malignant neoplasm of breast: Secondary | ICD-10-CM

## 2016-05-19 DIAGNOSIS — H2513 Age-related nuclear cataract, bilateral: Secondary | ICD-10-CM | POA: Diagnosis not present

## 2016-06-09 ENCOUNTER — Ambulatory Visit
Admission: RE | Admit: 2016-06-09 | Discharge: 2016-06-09 | Disposition: A | Discharge status: Home / Self Care | Payer: BLUE CROSS/BLUE SHIELD | Source: Ambulatory Visit | Attending: Obstetrics and Gynecology | Admitting: Obstetrics and Gynecology

## 2016-06-09 DIAGNOSIS — Z1231 Encounter for screening mammogram for malignant neoplasm of breast: Secondary | ICD-10-CM | POA: Diagnosis not present

## 2016-06-10 DIAGNOSIS — Z01419 Encounter for gynecological examination (general) (routine) without abnormal findings: Secondary | ICD-10-CM | POA: Diagnosis not present

## 2016-06-10 DIAGNOSIS — Z6834 Body mass index (BMI) 34.0-34.9, adult: Secondary | ICD-10-CM | POA: Diagnosis not present

## 2016-06-17 DIAGNOSIS — Z13820 Encounter for screening for osteoporosis: Secondary | ICD-10-CM | POA: Diagnosis not present

## 2016-06-17 DIAGNOSIS — N950 Postmenopausal bleeding: Secondary | ICD-10-CM | POA: Diagnosis not present

## 2016-06-26 NOTE — H&P (Signed)
Patient name Brittney Hatfield, Brittney Hatfield DICTATION# [id number] CSN# [id number]  [facility],Roman Dubuc S, MD 06/26/2016 8:36 AM

## 2016-06-26 NOTE — H&P (Signed)
Brittney Hatfield:  Hatfield, Brittney                ACCOUNT NO.:  [id number]  MEDICAL RECORD NO.:  [id number]  LOCATION:                                 FACILITY:  PHYSICIAN:  Brittney Hatfield, M.D.        DATE OF BIRTH:  DATE OF ADMISSION: DATE OF DISCHARGE:                             HISTORY & PHYSICAL   DATE OF SURGERY:  July 13, 2016.  [facility]  HISTORY OF PRESENT ILLNESS:  The patient is a 61 year old, gravida 4, para 2 postmenopausal patient, who presents for hysteroscopy.  The patient is postmenopausal.  She did have an episode of bleeding in January, lasted for 10 days.  Subsequent saline infusion ultrasound here in the office revealed either a 3.7 cm intrauterine fibroid and/or polyp.  The patient now presents for hysteroscopic resection.  ALLERGIES:  No known drug allergies.  MEDICATIONS:  She is on Mylicon, low-dose aspirin, and vitamin replacements.  PAST MEDICAL HISTORY:  She has had a history of mild diverticulosis. History of benign hematuria.  She is being followed for hyperlipidemia. She has borderline cardiomegaly on chest x-ray.  EKG was normal.  PAST SURGICAL HISTORY:  She has had 2 cesarean sections, bilateral tubal ligation.  Also has had knee surgery.  FAMILY HISTORY:  Father had a history of alcoholism and heart disease. Mother with history of mild CVA and breast cancer.  She also had hypertension.  She had 2 sisters who are alive and well.  SOCIAL HISTORY:  No alcohol or tobacco use.  REVIEW OF SYSTEMS:  Noncontributory.  PHYSICAL EXAMINATION:  VITAL SIGNS:  The patient is afebrile.  Stable vital signs. HEENT:  The patient is normocephalic.  Pupils are equal, round, and reactive to light and accommodation.  Extraocular movements were intact. Sclerae and conjunctivae clear.  Oropharynx clear. NECK:  Without thyromegaly. BREASTS:  Not examined. LUNGS:  Clear. CARDIOVASCULAR:  Regular rhythm and rate without  murmurs, rubs, or gallops.  There was no carotid or abdominal bruits. ABDOMEN:  Benign. PELVIC:  Normal external genitalia.  Vaginal mucosa is clear.  Cervix unremarkable in size.  Uterus normal in size, shape, and contour. Adnexa free of masses or tenderness. EXTREMITIES:  Trace edema. NEUROLOGIC:  Grossly within normal limits.  IMPRESSION:  Postmenopausal bleeding with intrauterine process either fibroid versus polyp.  PLAN:  The patient will undergo hysteroscopy with MyoSure resection. The risks have been discussed including risk of infection.  The risk of hemorrhage, could require transfusion with risk of AIDS or hepatitis. Excessive bleeding could require hysterectomy.  There was also the risk of perforation with injury to adjacent organs requiring exploratory surgery.  Risk of deep venous thrombosis and pulmonary embolus.  The patient expressed understanding of indications and risks.     Brittney Hatfield, M.D.     JSM/MEDQ  D:  06/26/2016  T:  06/26/2016  Job:  [id number]

## 2016-07-01 ENCOUNTER — Encounter (HOSPITAL_BASED_OUTPATIENT_CLINIC_OR_DEPARTMENT_OTHER): Payer: Self-pay | Admitting: *Deleted

## 2016-07-01 NOTE — Progress Notes (Signed)
NPO AFTER MN.  ARRIVE AT 0600.  GETTING CBC DONE Thursday 07-02-2016.  MAY TAKE TYLENOL AM DOS W/ SIPS OF WATER.  VERBALIZED UNDERSTANDING TO STOP ASA 5 DAYS PRIOR TO DOS AND STOP FISH OIL.

## 2016-07-02 ENCOUNTER — Other Ambulatory Visit (HOSPITAL_COMMUNITY): Payer: Self-pay | Admitting: Obstetrics and Gynecology

## 2016-07-02 ENCOUNTER — Other Ambulatory Visit (HOSPITAL_COMMUNITY)
Admit: 2016-07-02 | Discharge: 2016-07-02 | Disposition: A | Discharge status: Home / Self Care | Payer: BLUE CROSS/BLUE SHIELD | Source: Ambulatory Visit | Attending: Obstetrics and Gynecology | Admitting: Obstetrics and Gynecology

## 2016-07-02 DIAGNOSIS — N924 Excessive bleeding in the premenopausal period: Secondary | ICD-10-CM | POA: Diagnosis not present

## 2016-07-02 LAB — CBC
HEMATOCRIT: 41.6 % (ref 36.0–46.0)
HEMOGLOBIN: 14.2 g/dL (ref 12.0–15.0)
MCH: 29.6 pg (ref 26.0–34.0)
MCHC: 34.1 g/dL (ref 30.0–36.0)
MCV: 86.8 fL (ref 78.0–100.0)
Platelets: 390 10*3/uL (ref 150–400)
RBC: 4.79 MIL/uL (ref 3.87–5.11)
RDW: 13.2 % (ref 11.5–15.5)
WBC: 10.3 10*3/uL (ref 4.0–10.5)

## 2016-07-13 ENCOUNTER — Ambulatory Visit (HOSPITAL_BASED_OUTPATIENT_CLINIC_OR_DEPARTMENT_OTHER): Payer: BLUE CROSS/BLUE SHIELD | Admitting: Anesthesiology

## 2016-07-13 ENCOUNTER — Ambulatory Visit (HOSPITAL_BASED_OUTPATIENT_CLINIC_OR_DEPARTMENT_OTHER)
Admission: RE | Admit: 2016-07-13 | Discharge: 2016-07-13 | Disposition: A | Discharge status: Home / Self Care | Payer: BLUE CROSS/BLUE SHIELD | Source: Ambulatory Visit | Attending: Obstetrics and Gynecology | Admitting: Obstetrics and Gynecology

## 2016-07-13 ENCOUNTER — Encounter (HOSPITAL_BASED_OUTPATIENT_CLINIC_OR_DEPARTMENT_OTHER): Payer: Self-pay

## 2016-07-13 ENCOUNTER — Encounter (HOSPITAL_BASED_OUTPATIENT_CLINIC_OR_DEPARTMENT_OTHER)
Admission: RE | Disposition: A | Discharge status: Home / Self Care | Payer: Self-pay | Source: Ambulatory Visit | Attending: Obstetrics and Gynecology

## 2016-07-13 DIAGNOSIS — N840 Polyp of corpus uteri: Secondary | ICD-10-CM | POA: Diagnosis not present

## 2016-07-13 DIAGNOSIS — N950 Postmenopausal bleeding: Secondary | ICD-10-CM | POA: Diagnosis not present

## 2016-07-13 DIAGNOSIS — D259 Leiomyoma of uterus, unspecified: Secondary | ICD-10-CM | POA: Diagnosis not present

## 2016-07-13 DIAGNOSIS — S83249A Other tear of medial meniscus, current injury, unspecified knee, initial encounter: Secondary | ICD-10-CM | POA: Diagnosis not present

## 2016-07-13 DIAGNOSIS — N849 Polyp of female genital tract, unspecified: Secondary | ICD-10-CM | POA: Diagnosis not present

## 2016-07-13 HISTORY — DX: Other specified conditions associated with female genital organs and menstrual cycle: N94.89

## 2016-07-13 HISTORY — DX: Gastro-esophageal reflux disease without esophagitis: K21.9

## 2016-07-13 HISTORY — DX: Personal history of other complications of pregnancy, childbirth and the puerperium: Z87.59

## 2016-07-13 HISTORY — DX: Presence of spectacles and contact lenses: Z97.3

## 2016-07-13 HISTORY — DX: Postmenopausal bleeding: N95.0

## 2016-07-13 HISTORY — PX: DILATATION & CURETTAGE/HYSTEROSCOPY WITH MYOSURE: SHX6511

## 2016-07-13 SURGERY — DILATATION & CURETTAGE/HYSTEROSCOPY WITH MYOSURE
Anesthesia: General

## 2016-07-13 MED ORDER — FENTANYL CITRATE (PF) 100 MCG/2ML IJ SOLN
INTRAMUSCULAR | Status: DC | PRN
  Administered 2016-07-13 (×4): 25 ug via INTRAVENOUS

## 2016-07-13 MED ORDER — PROPOFOL 10 MG/ML IV BOLUS
INTRAVENOUS | Status: DC | PRN
  Administered 2016-07-13: 200 mg via INTRAVENOUS

## 2016-07-13 MED ORDER — SILVER NITRATE-POT NITRATE 75-25 % EX MISC
CUTANEOUS | Status: AC
  Filled 2016-07-13: qty 1

## 2016-07-13 MED ORDER — ACETAMINOPHEN 325 MG PO TABS
325.0000 mg | ORAL_TABLET | ORAL | Status: DC | PRN
  Filled 2016-07-13: qty 2

## 2016-07-13 MED ORDER — FENTANYL CITRATE (PF) 100 MCG/2ML IJ SOLN
INTRAMUSCULAR | Status: AC
  Filled 2016-07-13: qty 2

## 2016-07-13 MED ORDER — MEPERIDINE HCL 25 MG/ML IJ SOLN
6.2500 mg | INTRAMUSCULAR | Status: DC | PRN
  Filled 2016-07-13: qty 1

## 2016-07-13 MED ORDER — MIDAZOLAM HCL 2 MG/2ML IJ SOLN
INTRAMUSCULAR | Status: AC
Start: 2016-07-13 — End: 2016-07-13
  Filled 2016-07-13: qty 2

## 2016-07-13 MED ORDER — LIDOCAINE 2% (20 MG/ML) 5 ML SYRINGE
INTRAMUSCULAR | Status: AC
  Filled 2016-07-13: qty 5

## 2016-07-13 MED ORDER — MIDAZOLAM HCL 5 MG/5ML IJ SOLN
INTRAMUSCULAR | Status: DC | PRN
  Administered 2016-07-13: 2 mg via INTRAVENOUS

## 2016-07-13 MED ORDER — FENTANYL CITRATE (PF) 100 MCG/2ML IJ SOLN
25.0000 ug | INTRAMUSCULAR | Status: DC | PRN
  Filled 2016-07-13: qty 1

## 2016-07-13 MED ORDER — METOCLOPRAMIDE HCL 5 MG/ML IJ SOLN
INTRAMUSCULAR | Status: DC | PRN
  Administered 2016-07-13: 10 mg via INTRAVENOUS

## 2016-07-13 MED ORDER — LIDOCAINE HCL (CARDIAC) 20 MG/ML IV SOLN
INTRAVENOUS | Status: DC | PRN
  Administered 2016-07-13: 100 mg via INTRAVENOUS

## 2016-07-13 MED ORDER — OXYCODONE HCL 5 MG/5ML PO SOLN
5.0000 mg | Freq: Once | ORAL | Status: DC | PRN
  Filled 2016-07-13: qty 5

## 2016-07-13 MED ORDER — DEXAMETHASONE SODIUM PHOSPHATE 10 MG/ML IJ SOLN
INTRAMUSCULAR | Status: AC
  Filled 2016-07-13: qty 1

## 2016-07-13 MED ORDER — KETOROLAC TROMETHAMINE 30 MG/ML IJ SOLN
INTRAMUSCULAR | Status: DC | PRN
  Administered 2016-07-13: 30 mg via INTRAVENOUS

## 2016-07-13 MED ORDER — ONDANSETRON HCL 4 MG/2ML IJ SOLN
INTRAMUSCULAR | Status: AC
  Filled 2016-07-13: qty 2

## 2016-07-13 MED ORDER — LIDOCAINE HCL 1 % IJ SOLN
INTRAMUSCULAR | Status: AC
  Filled 2016-07-13: qty 20

## 2016-07-13 MED ORDER — METOCLOPRAMIDE HCL 5 MG/ML IJ SOLN
INTRAMUSCULAR | Status: AC
  Filled 2016-07-13: qty 2

## 2016-07-13 MED ORDER — ONDANSETRON HCL 4 MG/2ML IJ SOLN
4.0000 mg | Freq: Once | INTRAMUSCULAR | Status: DC | PRN
  Filled 2016-07-13: qty 2

## 2016-07-13 MED ORDER — ONDANSETRON HCL 4 MG/2ML IJ SOLN
INTRAMUSCULAR | Status: DC | PRN
  Administered 2016-07-13: 4 mg via INTRAVENOUS

## 2016-07-13 MED ORDER — SODIUM CHLORIDE 0.9 % IJ SOLN
INTRAMUSCULAR | Status: AC
  Filled 2016-07-13: qty 50

## 2016-07-13 MED ORDER — CEFAZOLIN SODIUM-DEXTROSE 2-4 GM/100ML-% IV SOLN
INTRAVENOUS | Status: AC
  Filled 2016-07-13: qty 100

## 2016-07-13 MED ORDER — CEFAZOLIN SODIUM-DEXTROSE 2-4 GM/100ML-% IV SOLN
2.0000 g | INTRAVENOUS | Status: AC
  Administered 2016-07-13: 2 g via INTRAVENOUS
  Filled 2016-07-13: qty 100

## 2016-07-13 MED ORDER — PROPOFOL 10 MG/ML IV BOLUS
INTRAVENOUS | Status: AC
  Filled 2016-07-13: qty 40

## 2016-07-13 MED ORDER — OXYCODONE-ACETAMINOPHEN 7.5-325 MG PO TABS: 1.0000 | ORAL_TABLET | ORAL | 0 refills | Status: AC | PRN

## 2016-07-13 MED ORDER — KETOROLAC TROMETHAMINE 30 MG/ML IJ SOLN
30.0000 mg | Freq: Once | INTRAMUSCULAR | Status: DC | PRN
  Filled 2016-07-13: qty 1

## 2016-07-13 MED ORDER — OXYCODONE HCL 5 MG PO TABS
5.0000 mg | ORAL_TABLET | Freq: Once | ORAL | Status: DC | PRN
  Filled 2016-07-13: qty 1

## 2016-07-13 MED ORDER — LACTATED RINGERS IV SOLN
INTRAVENOUS | Status: DC
  Administered 2016-07-13 (×2): via INTRAVENOUS
  Filled 2016-07-13: qty 1000

## 2016-07-13 MED ORDER — DEXAMETHASONE SODIUM PHOSPHATE 4 MG/ML IJ SOLN
INTRAMUSCULAR | Status: DC | PRN
  Administered 2016-07-13: 10 mg via INTRAVENOUS

## 2016-07-13 MED ORDER — ACETAMINOPHEN 160 MG/5ML PO SOLN
325.0000 mg | ORAL | Status: DC | PRN
  Filled 2016-07-13: qty 20.3

## 2016-07-13 SURGICAL SUPPLY — 33 items
CANISTER SUCT 3000ML PPV (MISCELLANEOUS) ×3 IMPLANT
CATH ROBINSON RED A/P 16FR (CATHETERS) ×2 IMPLANT
COVER BACK TABLE 60X90IN (DRAPES) ×3 IMPLANT
DEVICE MYOSURE LITE (MISCELLANEOUS) IMPLANT
DEVICE MYOSURE REACH (MISCELLANEOUS) ×2 IMPLANT
DILATOR CANAL MILEX (MISCELLANEOUS) IMPLANT
DRAPE LG THREE QUARTER DISP (DRAPES) ×3 IMPLANT
DRSG TELFA 3X8 NADH (GAUZE/BANDAGES/DRESSINGS) ×3 IMPLANT
ELECT REM PT RETURN 9FT ADLT (ELECTROSURGICAL) ×3
ELECTRODE REM PT RTRN 9FT ADLT (ELECTROSURGICAL) ×1 IMPLANT
FILTER ARTHROSCOPY CONVERTOR (FILTER) ×3 IMPLANT
GLOVE BIO SURGEON STRL SZ7 (GLOVE) ×6 IMPLANT
GOWN STRL REUS W/ TWL LRG LVL3 (GOWN DISPOSABLE) ×2 IMPLANT
GOWN STRL REUS W/TWL LRG LVL3 (GOWN DISPOSABLE) ×6
IV NS IRRIG 3000ML ARTHROMATIC (IV SOLUTION) ×5 IMPLANT
KIT RM TURNOVER CYSTO AR (KITS) ×3 IMPLANT
LEGGING LITHOTOMY PAIR STRL (DRAPES) ×3 IMPLANT
MYOSURE XL FIBROID REM (MISCELLANEOUS)
NDL SPNL 18GX3.5 QUINCKE PK (NEEDLE) ×1 IMPLANT
NEEDLE SPNL 18GX3.5 QUINCKE PK (NEEDLE) ×3 IMPLANT
PACK BASIN DAY SURGERY FS (CUSTOM PROCEDURE TRAY) ×3 IMPLANT
PAD DRESSING TELFA 3X8 NADH (GAUZE/BANDAGES/DRESSINGS) ×1 IMPLANT
PAD OB MATERNITY 4.3X12.25 (PERSONAL CARE ITEMS) ×3 IMPLANT
PAD PREP 24X48 CUFFED NSTRL (MISCELLANEOUS) ×3 IMPLANT
SEAL ROD LENS SCOPE MYOSURE (ABLATOR) ×3 IMPLANT
SYR CONTROL 10ML LL (SYRINGE) ×3 IMPLANT
SYSTEM TISS REMOVAL MYSR XL RM (MISCELLANEOUS) IMPLANT
TOWEL OR 17X24 6PK STRL BLUE (TOWEL DISPOSABLE) ×6 IMPLANT
TUBE CONNECTING 12'X1/4 (SUCTIONS)
TUBE CONNECTING 12X1/4 (SUCTIONS) IMPLANT
TUBING AQUILEX INFLOW (TUBING) ×3 IMPLANT
TUBING AQUILEX OUTFLOW (TUBING) ×3 IMPLANT
WATER STERILE IRR 500ML POUR (IV SOLUTION) ×3 IMPLANT

## 2016-07-13 NOTE — H&P (Signed)
  History and physical exam unchanged 

## 2016-07-13 NOTE — Transfer of Care (Signed)
Immediate Anesthesia Transfer of Care Note  Patient: Brittney Hatfield  Procedure(s) Performed: Procedure(s) (LRB): DILATATION & CURETTAGE/HYSTEROSCOPY WITH MYOSURE (N/A)  Patient Location: PACU  Anesthesia Type: General  Level of Consciousness: awake, sedated, patient cooperative and responds to stimulation  Airway & Oxygen Therapy: Patient Spontanous Breathing and Patient connected to [place] O2  Post-op Assessment: Report given to PACU RN, Post -op Vital signs reviewed and stable and Patient moving all extremities  Post vital signs: Reviewed and stable  Complications: No apparent anesthesia complications

## 2016-07-13 NOTE — Anesthesia Preprocedure Evaluation (Signed)
Anesthesia Evaluation  Patient identified by MRN, date of birth, ID band Patient awake    Reviewed: Allergy & Precautions, H&P , NPO status , Patient's Chart, lab work & pertinent test results  Airway Mallampati: II  TM Distance: >3 FB Neck ROM: Full    Dental no notable dental hx.    Pulmonary neg pulmonary ROS,    Pulmonary exam normal breath sounds clear to auscultation       Cardiovascular negative cardio ROS Normal cardiovascular exam Rhythm:Regular Rate:Normal     Neuro/Psych negative neurological ROS  negative psych ROS   GI/Hepatic Neg liver ROS,   Endo/Other  negative endocrine ROS  Renal/GU negative Renal ROS     Musculoskeletal   Abdominal (+) + obese,   Peds  Hematology negative hematology ROS (+)   Anesthesia Other Findings   Reproductive/Obstetrics negative OB ROS                             Anesthesia Physical  Anesthesia Plan  ASA: II  Anesthesia Plan: General   Post-op Pain Management:    Induction: Intravenous  Airway Management Planned: LMA  Additional Equipment:   Intra-op Plan:   Post-operative Plan: Extubation in OR  Informed Consent: I have reviewed the patients History and Physical, chart, labs and discussed the procedure including the risks, benefits and alternatives for the proposed anesthesia with the patient or authorized representative who has indicated his/her understanding and acceptance.     Plan Discussed with: CRNA and Surgeon  Anesthesia Plan Comments:         Anesthesia Quick Evaluation

## 2016-07-13 NOTE — Op Note (Signed)
NAME:  Brittney Hatfield, Brittney Hatfield NO.:  [id number]  MEDICAL RECORD NO.:  [id number]  LOCATION:                                 FACILITY:  PHYSICIAN:  Juluis Mire, M.D.        DATE OF BIRTH:  DATE OF PROCEDURE:  07/13/2016 DATE OF DISCHARGE:                              OPERATIVE REPORT   PREOPERATIVE DIAGNOSIS:  Endometrial polyp.  POSTOPERATIVE DIAGNOSIS:  Endometrial polyp.  OPERATIVE PROCEDURE:  Cervical dilation, hysteroscopy with resection of endometrial polyp along with endometrial curettings.  SURGEON:  Juluis Mire, M.D.  ANESTHESIA:  General.  ESTIMATED BLOOD LOSS:  100 mL.  PACKS:  None.  DRAINS:  None.  INTRAOPERATIVE BLOOD PLACED:  None.  COMPLICATIONS:  None.  INDICATION:  Dictated in history and physical.  PROCEDURE IN DETAIL:  The patient was taken to the OR, placed in supine position.  After satisfactory level of general anesthesia obtained, the patient has been placed in the dorsal lithotomy position.  Perineum and vagina were prepped out with Betadine and draped in sterile field. Speculum was placed in vaginal vault.  Cervix was grasped with a single- tooth tenaculum.  Uterus sounded to approximately 9 cm.  Cervix was serially dilated to a size 23 Pratt dilator.  Hysteroscope was introduced.  The intrauterine cavity was distended using saline. Visualization did reveal an intrauterine polyp.  We brought in the MyoSure.  We were able to resect part of the polyp __________ became obscured from blood and __________ with the scope.  At this point in time, we removed the scope, changed it out, but we used a Randall stone forceps.  I felt like we retrieved the majority of the polyp with that. We then repeated the hysteroscopy and I could see no further evidence of the polyp.  This was all sent for Pathology.  We then obtained endometrial curettings.  We had fairly good hemostasis at that point. Total deficit was 500 mL.  At this point in  time single-tooth tenaculum was inspected and then removed.  We observed for any heavy bleeding.  There was none.  The patient was taken out of the dorsal lithotomy position.  Once alert and extubated, transferred to recovery room in good condition.  Sponge, instrument, and needle counts were correct by circulating nurse x2.     Juluis Mire, M.D.     JSM/MEDQ  D:  07/13/2016  T:  07/13/2016  Job:  [id number]

## 2016-07-13 NOTE — Brief Op Note (Signed)
07/13/2016  8:15 AM  PATIENT:  Brittney Hatfield  61 y.o. female  PRE-OPERATIVE DIAGNOSIS:  polyp vs fibroid  POST-OPERATIVE DIAGNOSIS:  * No post-op diagnosis entered *  PROCEDURE:  Procedure(s): DILATATION & CURETTAGE/HYSTEROSCOPY WITH MYOSURE (N/A)  SURGEON:  Surgeon(s) and Role:    * Richardean Chimera, MD - Primary  PHYSICIAN ASSISTANT:   ASSISTANTS: none   ANESTHESIA:   general  EBL:  Total I/O In: 200 [I.V.:200] Out: -   BLOOD ADMINISTERED:none  DRAINS: none   LOCAL MEDICATIONS USED:  NONE  SPECIMEN:  Source of Specimen:  endometrial polyp and curretting  DISPOSITION OF SPECIMEN:  PATHOLOGY  COUNTS:  YES  TOURNIQUET:  * No tourniquets in log *  DICTATION: .Other Dictation: Dictation Number [id number]  PLAN OF CARE: Discharge to home after PACU  PATIENT DISPOSITION:  PACU - hemodynamically stable.   Delay start of Pharmacological VTE agent (>24hrs) due to surgical blood loss or risk of bleeding: not applicable

## 2016-07-13 NOTE — Anesthesia Postprocedure Evaluation (Addendum)
Anesthesia Post Note  Patient: ELONDA GIULIANO  Procedure(s) Performed: Procedure(s) (LRB): DILATATION & CURETTAGE/HYSTEROSCOPY WITH MYOSURE (N/A)  Patient location during evaluation: PACU Anesthesia Type: General Level of consciousness: awake Pain management: pain level controlled Vital Signs Assessment: post-procedure vital signs reviewed and stable Respiratory status: spontaneous breathing Cardiovascular status: stable Postop Assessment: no signs of nausea or vomiting Anesthetic complications: no        Last Vitals:  Vitals:   07/13/16 0900 07/13/16 0912  BP: 119/83   Pulse: (!) 57 67  Resp: 11 12  Temp:      Last Pain:  Vitals:   07/13/16 0850  TempSrc:   PainSc: Asleep   Pain Goal: Patients Stated Pain Goal: 7 (07/13/16 0637)               Daylene Vandenbosch JR,JOHN Susann Givens

## 2016-07-13 NOTE — Discharge Instructions (Signed)
° °  D & C Home care Instructions: ° ° °Personal hygiene:  Used sanitary napkins for vaginal drainage not tampons. Shower or tub bathe the day after your procedure. No douching until bleeding stops. Always wipe from front to back after  Elimination. ° °Activity: Do not drive or operate any equipment today. The effects of the anesthesia are still present and drowsiness may result. Rest today, not necessarily flat bed rest, just take it easy. You may resume your normal activity in one to 2 days. ° °Sexual activity: No intercourse for one week or as indicated by your physician ° °Diet: Eat a light diet as desired this evening. You may resume a regular diet tomorrow. ° °Return to work: One to 2 days. ° °General Expectations of your surgery: Vaginal bleeding should be no heavier than a normal period. Spotting may continue up to 10 days. Mild cramps may continue for a couple of days. You may have a regular period in 2-6 weeks. ° °Unexpected observations call your doctor if these occur: persistent or heavy bleeding. Severe abdominal cramping or pain. Elevation of temperature greater than 100°F. ° ° °Post Anesthesia Home Care Instructions ° °Activity: °Get plenty of rest for the remainder of the day. A responsible individual must stay with you for 24 hours following the procedure.  °For the next 24 hours, DO NOT: °-Drive a car °-Operate machinery °-Drink alcoholic beverages °-Take any medication unless instructed by your physician °-Make any legal decisions or sign important papers. ° °Meals: °Start with liquid foods such as gelatin or soup. Progress to regular foods as tolerated. Avoid greasy, spicy, heavy foods. If nausea and/or vomiting occur, drink only clear liquids until the nausea and/or vomiting subsides. Call your physician if vomiting continues. ° °Special Instructions/Symptoms: °Your throat may feel dry or sore from the anesthesia or the breathing tube placed in your throat during surgery. If this causes  discomfort, gargle with warm salt water. The discomfort should disappear within 24 hours. ° °If you had a scopolamine patch placed behind your ear for the management of post- operative nausea and/or vomiting: ° °1. The medication in the patch is effective for 72 hours, after which it should be removed.  Wrap patch in a tissue and discard in the trash. Wash hands thoroughly with soap and water. °2. You may remove the patch earlier than 72 hours if you experience unpleasant side effects which may include dry mouth, dizziness or visual disturbances. °3. Avoid touching the patch. Wash your hands with soap and water after contact with the patch. °   ° °

## 2016-07-13 NOTE — Anesthesia Procedure Notes (Signed)
Procedure Name: LMA Insertion Date/Time: 07/13/2016 7:33 AM Performed by: Jessica Priest Pre-anesthesia Checklist: Patient identified, Emergency Drugs available, Suction available and Patient being monitored Patient Re-evaluated:Patient Re-evaluated prior to inductionOxygen Delivery Method: Circle system utilized Preoxygenation: Pre-oxygenation with 100% oxygen Intubation Type: IV induction Ventilation: Mask ventilation without difficulty LMA: LMA inserted LMA Size: 4.0 Number of attempts: 1 Airway Equipment and Method: Bite block Placement Confirmation: positive ETCO2 Tube secured with: Tape Dental Injury: Teeth and Oropharynx as per pre-operative assessment

## 2016-07-14 ENCOUNTER — Encounter (HOSPITAL_BASED_OUTPATIENT_CLINIC_OR_DEPARTMENT_OTHER): Payer: Self-pay | Admitting: Obstetrics and Gynecology

## 2016-09-11 NOTE — Addendum Note (Signed)
Addendum  created 09/11/16 1144 by Huriel Matt, MD   Sign clinical note    

## 2017-02-09 DIAGNOSIS — Z0000 Encounter for general adult medical examination without abnormal findings: Secondary | ICD-10-CM | POA: Diagnosis not present

## 2017-02-09 DIAGNOSIS — E7849 Other hyperlipidemia: Secondary | ICD-10-CM | POA: Diagnosis not present

## 2017-02-09 DIAGNOSIS — M859 Disorder of bone density and structure, unspecified: Secondary | ICD-10-CM | POA: Diagnosis not present

## 2017-02-16 DIAGNOSIS — Z0000 Encounter for general adult medical examination without abnormal findings: Secondary | ICD-10-CM | POA: Diagnosis not present

## 2017-02-16 DIAGNOSIS — N840 Polyp of corpus uteri: Secondary | ICD-10-CM | POA: Diagnosis not present

## 2017-02-16 DIAGNOSIS — I872 Venous insufficiency (chronic) (peripheral): Secondary | ICD-10-CM | POA: Diagnosis not present

## 2017-02-16 DIAGNOSIS — E668 Other obesity: Secondary | ICD-10-CM | POA: Diagnosis not present

## 2017-02-16 DIAGNOSIS — F39 Unspecified mood [affective] disorder: Secondary | ICD-10-CM | POA: Diagnosis not present

## 2017-02-16 DIAGNOSIS — Z1389 Encounter for screening for other disorder: Secondary | ICD-10-CM | POA: Diagnosis not present

## 2017-03-22 DIAGNOSIS — Z1211 Encounter for screening for malignant neoplasm of colon: Secondary | ICD-10-CM | POA: Diagnosis not present

## 2017-05-03 ENCOUNTER — Other Ambulatory Visit: Payer: Self-pay | Admitting: Obstetrics and Gynecology

## 2017-05-03 DIAGNOSIS — Z1231 Encounter for screening mammogram for malignant neoplasm of breast: Secondary | ICD-10-CM

## 2017-05-26 DIAGNOSIS — H2513 Age-related nuclear cataract, bilateral: Secondary | ICD-10-CM | POA: Diagnosis not present

## 2017-06-10 ENCOUNTER — Ambulatory Visit
Admission: RE | Admit: 2017-06-10 | Discharge: 2017-06-10 | Disposition: A | Discharge status: Home / Self Care | Payer: BLUE CROSS/BLUE SHIELD | Source: Ambulatory Visit | Attending: Obstetrics and Gynecology | Admitting: Obstetrics and Gynecology

## 2017-06-10 DIAGNOSIS — Z1231 Encounter for screening mammogram for malignant neoplasm of breast: Secondary | ICD-10-CM

## 2017-06-11 DIAGNOSIS — M21611 Bunion of right foot: Secondary | ICD-10-CM | POA: Diagnosis not present

## 2017-06-11 DIAGNOSIS — M79671 Pain in right foot: Secondary | ICD-10-CM | POA: Diagnosis not present

## 2017-06-11 DIAGNOSIS — M2041 Other hammer toe(s) (acquired), right foot: Secondary | ICD-10-CM | POA: Diagnosis not present

## 2017-06-11 DIAGNOSIS — M205X1 Other deformities of toe(s) (acquired), right foot: Secondary | ICD-10-CM | POA: Diagnosis not present

## 2017-06-15 DIAGNOSIS — Z01419 Encounter for gynecological examination (general) (routine) without abnormal findings: Secondary | ICD-10-CM | POA: Diagnosis not present

## 2017-06-15 DIAGNOSIS — Z6833 Body mass index (BMI) 33.0-33.9, adult: Secondary | ICD-10-CM | POA: Diagnosis not present

## 2017-06-18 DIAGNOSIS — M79671 Pain in right foot: Secondary | ICD-10-CM | POA: Diagnosis not present

## 2017-06-29 DIAGNOSIS — M2030 Hallux varus (acquired), unspecified foot: Secondary | ICD-10-CM | POA: Diagnosis not present

## 2017-06-29 DIAGNOSIS — M2010 Hallux valgus (acquired), unspecified foot: Secondary | ICD-10-CM | POA: Diagnosis not present

## 2017-06-29 DIAGNOSIS — M7741 Metatarsalgia, right foot: Secondary | ICD-10-CM | POA: Diagnosis not present

## 2017-06-29 DIAGNOSIS — M205X9 Other deformities of toe(s) (acquired), unspecified foot: Secondary | ICD-10-CM | POA: Diagnosis not present

## 2017-06-29 DIAGNOSIS — G8918 Other acute postprocedural pain: Secondary | ICD-10-CM | POA: Diagnosis not present

## 2017-06-29 DIAGNOSIS — M21611 Bunion of right foot: Secondary | ICD-10-CM | POA: Diagnosis not present

## 2017-06-29 DIAGNOSIS — M205X1 Other deformities of toe(s) (acquired), right foot: Secondary | ICD-10-CM | POA: Diagnosis not present

## 2017-06-29 DIAGNOSIS — M216X1 Other acquired deformities of right foot: Secondary | ICD-10-CM | POA: Diagnosis not present

## 2017-06-29 DIAGNOSIS — M2041 Other hammer toe(s) (acquired), right foot: Secondary | ICD-10-CM | POA: Diagnosis not present

## 2017-07-14 DIAGNOSIS — M21611 Bunion of right foot: Secondary | ICD-10-CM | POA: Diagnosis not present

## 2017-08-11 DIAGNOSIS — M79671 Pain in right foot: Secondary | ICD-10-CM | POA: Diagnosis not present

## 2017-08-11 DIAGNOSIS — M21611 Bunion of right foot: Secondary | ICD-10-CM | POA: Diagnosis not present

## 2017-09-13 DIAGNOSIS — M79671 Pain in right foot: Secondary | ICD-10-CM | POA: Diagnosis not present

## 2017-10-13 DIAGNOSIS — M79671 Pain in right foot: Secondary | ICD-10-CM | POA: Diagnosis not present

## 2017-10-13 DIAGNOSIS — M7989 Other specified soft tissue disorders: Secondary | ICD-10-CM | POA: Diagnosis not present

## 2017-11-02 DIAGNOSIS — M79671 Pain in right foot: Secondary | ICD-10-CM | POA: Diagnosis not present

## 2017-11-09 DIAGNOSIS — M79671 Pain in right foot: Secondary | ICD-10-CM | POA: Diagnosis not present

## 2017-11-11 DIAGNOSIS — M1712 Unilateral primary osteoarthritis, left knee: Secondary | ICD-10-CM | POA: Diagnosis not present

## 2017-11-12 DIAGNOSIS — M79671 Pain in right foot: Secondary | ICD-10-CM | POA: Diagnosis not present

## 2017-11-16 DIAGNOSIS — M79671 Pain in right foot: Secondary | ICD-10-CM | POA: Diagnosis not present

## 2017-11-19 DIAGNOSIS — M79671 Pain in right foot: Secondary | ICD-10-CM | POA: Diagnosis not present

## 2017-11-23 DIAGNOSIS — M79671 Pain in right foot: Secondary | ICD-10-CM | POA: Diagnosis not present

## 2017-11-25 DIAGNOSIS — M79671 Pain in right foot: Secondary | ICD-10-CM | POA: Diagnosis not present

## 2017-11-30 DIAGNOSIS — M79671 Pain in right foot: Secondary | ICD-10-CM | POA: Diagnosis not present

## 2017-12-02 DIAGNOSIS — M79671 Pain in right foot: Secondary | ICD-10-CM | POA: Diagnosis not present

## 2018-03-15 DIAGNOSIS — R5383 Other fatigue: Secondary | ICD-10-CM | POA: Diagnosis not present

## 2018-03-15 DIAGNOSIS — R82998 Other abnormal findings in urine: Secondary | ICD-10-CM | POA: Diagnosis not present

## 2018-03-15 DIAGNOSIS — M8580 Other specified disorders of bone density and structure, unspecified site: Secondary | ICD-10-CM | POA: Diagnosis not present

## 2018-03-15 DIAGNOSIS — E7849 Other hyperlipidemia: Secondary | ICD-10-CM | POA: Diagnosis not present

## 2018-03-15 DIAGNOSIS — Z0000 Encounter for general adult medical examination without abnormal findings: Secondary | ICD-10-CM | POA: Diagnosis not present

## 2018-03-22 DIAGNOSIS — R42 Dizziness and giddiness: Secondary | ICD-10-CM | POA: Diagnosis not present

## 2018-03-22 DIAGNOSIS — M859 Disorder of bone density and structure, unspecified: Secondary | ICD-10-CM | POA: Diagnosis not present

## 2018-03-22 DIAGNOSIS — Z1331 Encounter for screening for depression: Secondary | ICD-10-CM | POA: Diagnosis not present

## 2018-03-22 DIAGNOSIS — Z1212 Encounter for screening for malignant neoplasm of rectum: Secondary | ICD-10-CM | POA: Diagnosis not present

## 2018-03-22 DIAGNOSIS — E7849 Other hyperlipidemia: Secondary | ICD-10-CM | POA: Diagnosis not present

## 2018-03-22 DIAGNOSIS — Z0000 Encounter for general adult medical examination without abnormal findings: Secondary | ICD-10-CM | POA: Diagnosis not present

## 2018-03-22 DIAGNOSIS — E668 Other obesity: Secondary | ICD-10-CM | POA: Diagnosis not present

## 2018-05-03 ENCOUNTER — Other Ambulatory Visit: Payer: Self-pay | Admitting: Obstetrics and Gynecology

## 2018-05-03 DIAGNOSIS — Z1231 Encounter for screening mammogram for malignant neoplasm of breast: Secondary | ICD-10-CM

## 2018-05-28 DIAGNOSIS — J069 Acute upper respiratory infection, unspecified: Secondary | ICD-10-CM | POA: Diagnosis not present

## 2018-06-16 ENCOUNTER — Other Ambulatory Visit: Payer: Self-pay

## 2018-06-16 ENCOUNTER — Ambulatory Visit
Admission: RE | Admit: 2018-06-16 | Discharge: 2018-06-16 | Disposition: A | Discharge status: Home / Self Care | Payer: BLUE CROSS/BLUE SHIELD | Source: Ambulatory Visit | Attending: Obstetrics and Gynecology | Admitting: Obstetrics and Gynecology

## 2018-06-16 DIAGNOSIS — Z1231 Encounter for screening mammogram for malignant neoplasm of breast: Secondary | ICD-10-CM | POA: Diagnosis not present

## 2018-06-23 DIAGNOSIS — Z6833 Body mass index (BMI) 33.0-33.9, adult: Secondary | ICD-10-CM | POA: Diagnosis not present

## 2018-06-23 DIAGNOSIS — Z01419 Encounter for gynecological examination (general) (routine) without abnormal findings: Secondary | ICD-10-CM | POA: Diagnosis not present

## 2018-09-14 DIAGNOSIS — E7849 Other hyperlipidemia: Secondary | ICD-10-CM | POA: Diagnosis not present

## 2018-09-19 DIAGNOSIS — E785 Hyperlipidemia, unspecified: Secondary | ICD-10-CM | POA: Diagnosis not present

## 2018-09-19 DIAGNOSIS — E669 Obesity, unspecified: Secondary | ICD-10-CM | POA: Diagnosis not present

## 2018-09-19 DIAGNOSIS — I872 Venous insufficiency (chronic) (peripheral): Secondary | ICD-10-CM | POA: Diagnosis not present

## 2018-09-19 DIAGNOSIS — R42 Dizziness and giddiness: Secondary | ICD-10-CM | POA: Diagnosis not present

## 2019-03-10 DIAGNOSIS — M1712 Unilateral primary osteoarthritis, left knee: Secondary | ICD-10-CM | POA: Diagnosis not present

## 2019-03-17 DIAGNOSIS — M1712 Unilateral primary osteoarthritis, left knee: Secondary | ICD-10-CM | POA: Diagnosis not present

## 2019-03-23 DIAGNOSIS — H40013 Open angle with borderline findings, low risk, bilateral: Secondary | ICD-10-CM | POA: Diagnosis not present

## 2019-03-23 DIAGNOSIS — H52203 Unspecified astigmatism, bilateral: Secondary | ICD-10-CM | POA: Diagnosis not present

## 2019-03-23 DIAGNOSIS — H5213 Myopia, bilateral: Secondary | ICD-10-CM | POA: Diagnosis not present

## 2019-03-24 DIAGNOSIS — M1712 Unilateral primary osteoarthritis, left knee: Secondary | ICD-10-CM | POA: Diagnosis not present

## 2019-04-06 DIAGNOSIS — M859 Disorder of bone density and structure, unspecified: Secondary | ICD-10-CM | POA: Diagnosis not present

## 2019-04-06 DIAGNOSIS — E7849 Other hyperlipidemia: Secondary | ICD-10-CM | POA: Diagnosis not present

## 2019-04-14 DIAGNOSIS — K219 Gastro-esophageal reflux disease without esophagitis: Secondary | ICD-10-CM | POA: Diagnosis not present

## 2019-04-14 DIAGNOSIS — Z0000 Encounter for general adult medical examination without abnormal findings: Secondary | ICD-10-CM | POA: Diagnosis not present

## 2019-04-14 DIAGNOSIS — Z1331 Encounter for screening for depression: Secondary | ICD-10-CM | POA: Diagnosis not present

## 2019-04-14 DIAGNOSIS — M8580 Other specified disorders of bone density and structure, unspecified site: Secondary | ICD-10-CM | POA: Diagnosis not present

## 2019-04-14 DIAGNOSIS — E785 Hyperlipidemia, unspecified: Secondary | ICD-10-CM | POA: Diagnosis not present

## 2019-04-14 DIAGNOSIS — E669 Obesity, unspecified: Secondary | ICD-10-CM | POA: Diagnosis not present

## 2019-05-01 ENCOUNTER — Other Ambulatory Visit: Payer: Self-pay | Admitting: Obstetrics and Gynecology

## 2019-05-01 DIAGNOSIS — Z1231 Encounter for screening mammogram for malignant neoplasm of breast: Secondary | ICD-10-CM

## 2019-06-19 ENCOUNTER — Ambulatory Visit
Admission: RE | Admit: 2019-06-19 | Discharge: 2019-06-19 | Disposition: A | Discharge status: Home / Self Care | Payer: BC Managed Care – PPO | Source: Ambulatory Visit | Attending: Obstetrics and Gynecology | Admitting: Obstetrics and Gynecology

## 2019-06-19 ENCOUNTER — Other Ambulatory Visit: Payer: Self-pay

## 2019-06-19 DIAGNOSIS — Z1231 Encounter for screening mammogram for malignant neoplasm of breast: Secondary | ICD-10-CM | POA: Diagnosis not present

## 2019-06-26 DIAGNOSIS — Z13820 Encounter for screening for osteoporosis: Secondary | ICD-10-CM | POA: Diagnosis not present

## 2019-06-26 DIAGNOSIS — Z6833 Body mass index (BMI) 33.0-33.9, adult: Secondary | ICD-10-CM | POA: Diagnosis not present

## 2019-06-26 DIAGNOSIS — Z01419 Encounter for gynecological examination (general) (routine) without abnormal findings: Secondary | ICD-10-CM | POA: Diagnosis not present

## 2020-03-25 DIAGNOSIS — H40013 Open angle with borderline findings, low risk, bilateral: Secondary | ICD-10-CM | POA: Diagnosis not present

## 2020-03-25 DIAGNOSIS — H5213 Myopia, bilateral: Secondary | ICD-10-CM | POA: Diagnosis not present

## 2020-04-03 DIAGNOSIS — M1712 Unilateral primary osteoarthritis, left knee: Secondary | ICD-10-CM | POA: Diagnosis not present

## 2020-05-03 ENCOUNTER — Other Ambulatory Visit: Payer: Self-pay | Admitting: Obstetrics and Gynecology

## 2020-05-03 DIAGNOSIS — Z1231 Encounter for screening mammogram for malignant neoplasm of breast: Secondary | ICD-10-CM

## 2020-05-06 DIAGNOSIS — E785 Hyperlipidemia, unspecified: Secondary | ICD-10-CM | POA: Diagnosis not present

## 2020-05-06 DIAGNOSIS — M859 Disorder of bone density and structure, unspecified: Secondary | ICD-10-CM | POA: Diagnosis not present

## 2020-05-13 DIAGNOSIS — R82998 Other abnormal findings in urine: Secondary | ICD-10-CM | POA: Diagnosis not present

## 2020-05-13 DIAGNOSIS — E785 Hyperlipidemia, unspecified: Secondary | ICD-10-CM | POA: Diagnosis not present

## 2020-05-13 DIAGNOSIS — Z1212 Encounter for screening for malignant neoplasm of rectum: Secondary | ICD-10-CM | POA: Diagnosis not present

## 2020-05-13 DIAGNOSIS — Z0000 Encounter for general adult medical examination without abnormal findings: Secondary | ICD-10-CM | POA: Diagnosis not present

## 2020-06-17 ENCOUNTER — Inpatient Hospital Stay: Admission: RE | Admit: 2020-06-17 | Payer: BC Managed Care – PPO | Source: Ambulatory Visit

## 2020-06-20 ENCOUNTER — Ambulatory Visit
Admission: RE | Admit: 2020-06-20 | Discharge: 2020-06-20 | Disposition: A | Discharge status: Home / Self Care | Payer: BC Managed Care – PPO | Source: Ambulatory Visit | Attending: Obstetrics and Gynecology | Admitting: Obstetrics and Gynecology

## 2020-06-20 ENCOUNTER — Other Ambulatory Visit: Payer: Self-pay

## 2020-06-20 DIAGNOSIS — Z1231 Encounter for screening mammogram for malignant neoplasm of breast: Secondary | ICD-10-CM | POA: Diagnosis not present

## 2020-06-26 DIAGNOSIS — Z01419 Encounter for gynecological examination (general) (routine) without abnormal findings: Secondary | ICD-10-CM | POA: Diagnosis not present

## 2020-06-26 DIAGNOSIS — Z6834 Body mass index (BMI) 34.0-34.9, adult: Secondary | ICD-10-CM | POA: Diagnosis not present

## 2020-06-27 ENCOUNTER — Other Ambulatory Visit: Payer: Self-pay | Admitting: Obstetrics and Gynecology

## 2020-06-27 DIAGNOSIS — Z9189 Other specified personal risk factors, not elsewhere classified: Secondary | ICD-10-CM

## 2020-07-23 ENCOUNTER — Other Ambulatory Visit: Payer: Self-pay

## 2020-07-23 ENCOUNTER — Ambulatory Visit
Admission: RE | Admit: 2020-07-23 | Discharge: 2020-07-23 | Disposition: A | Discharge status: Home / Self Care | Payer: BC Managed Care – PPO | Source: Ambulatory Visit | Attending: Obstetrics and Gynecology | Admitting: Obstetrics and Gynecology

## 2020-07-23 DIAGNOSIS — Z803 Family history of malignant neoplasm of breast: Secondary | ICD-10-CM | POA: Diagnosis not present

## 2020-07-23 DIAGNOSIS — Z9189 Other specified personal risk factors, not elsewhere classified: Secondary | ICD-10-CM

## 2020-07-23 MED ORDER — GADOBUTROL 1 MMOL/ML IV SOLN
9.0000 mL | Freq: Once | INTRAVENOUS | Status: AC | PRN
  Administered 2020-07-23: 9 mL via INTRAVENOUS

## 2021-04-07 DIAGNOSIS — H25013 Cortical age-related cataract, bilateral: Secondary | ICD-10-CM | POA: Diagnosis not present

## 2021-04-07 DIAGNOSIS — H524 Presbyopia: Secondary | ICD-10-CM | POA: Diagnosis not present

## 2021-04-07 DIAGNOSIS — H40023 Open angle with borderline findings, high risk, bilateral: Secondary | ICD-10-CM | POA: Diagnosis not present

## 2021-04-14 ENCOUNTER — Other Ambulatory Visit: Payer: Self-pay | Admitting: Obstetrics and Gynecology

## 2021-04-14 DIAGNOSIS — Z1231 Encounter for screening mammogram for malignant neoplasm of breast: Secondary | ICD-10-CM

## 2021-06-10 DIAGNOSIS — E785 Hyperlipidemia, unspecified: Secondary | ICD-10-CM | POA: Diagnosis not present

## 2021-06-17 DIAGNOSIS — Z0000 Encounter for general adult medical examination without abnormal findings: Secondary | ICD-10-CM | POA: Diagnosis not present

## 2021-06-17 DIAGNOSIS — Z23 Encounter for immunization: Secondary | ICD-10-CM | POA: Diagnosis not present

## 2021-06-17 DIAGNOSIS — Z1212 Encounter for screening for malignant neoplasm of rectum: Secondary | ICD-10-CM | POA: Diagnosis not present

## 2021-06-17 DIAGNOSIS — R82998 Other abnormal findings in urine: Secondary | ICD-10-CM | POA: Diagnosis not present

## 2021-06-17 DIAGNOSIS — Z1339 Encounter for screening examination for other mental health and behavioral disorders: Secondary | ICD-10-CM | POA: Diagnosis not present

## 2021-06-17 DIAGNOSIS — I872 Venous insufficiency (chronic) (peripheral): Secondary | ICD-10-CM | POA: Diagnosis not present

## 2021-06-17 DIAGNOSIS — Z1331 Encounter for screening for depression: Secondary | ICD-10-CM | POA: Diagnosis not present

## 2021-06-20 ENCOUNTER — Ambulatory Visit
Admission: RE | Admit: 2021-06-20 | Discharge: 2021-06-20 | Disposition: A | Discharge status: Home / Self Care | Payer: BC Managed Care – PPO | Source: Ambulatory Visit | Attending: Obstetrics and Gynecology | Admitting: Obstetrics and Gynecology

## 2021-06-20 ENCOUNTER — Other Ambulatory Visit: Payer: Self-pay

## 2021-06-20 DIAGNOSIS — Z1231 Encounter for screening mammogram for malignant neoplasm of breast: Secondary | ICD-10-CM | POA: Diagnosis not present

## 2021-09-15 DIAGNOSIS — Z1151 Encounter for screening for human papillomavirus (HPV): Secondary | ICD-10-CM | POA: Diagnosis not present

## 2021-09-15 DIAGNOSIS — Z01419 Encounter for gynecological examination (general) (routine) without abnormal findings: Secondary | ICD-10-CM | POA: Diagnosis not present

## 2021-09-15 DIAGNOSIS — Z13820 Encounter for screening for osteoporosis: Secondary | ICD-10-CM | POA: Diagnosis not present

## 2021-09-15 DIAGNOSIS — Z124 Encounter for screening for malignant neoplasm of cervix: Secondary | ICD-10-CM | POA: Diagnosis not present

## 2021-09-15 DIAGNOSIS — Z6831 Body mass index (BMI) 31.0-31.9, adult: Secondary | ICD-10-CM | POA: Diagnosis not present

## 2021-10-25 IMAGING — MR MR BREAST BILAT WO/W CM
8 of 12 series · 33 of 48 positions shown · IV contrast (Multihance)
Comparison: No previous MRI.

CLINICAL DATA: High risk for breast cancer. Family history of
breast cancer, including mother and grandmother.

LABS:  Not performed at [REDACTED].
EXAM:
BILATERAL BREAST MRI WITH AND WITHOUT CONTRAST
TECHNIQUE: Multiplanar, multisequence MR images of both breasts were obtained
prior to and following the intravenous administration of 9 ml of
Gadavist

[Series 2: t2_tirm_tra ipat (a-p) · axial · 3.0mm · 0.70mm/px · 1 of 55 slices shown]
[im 1/55]
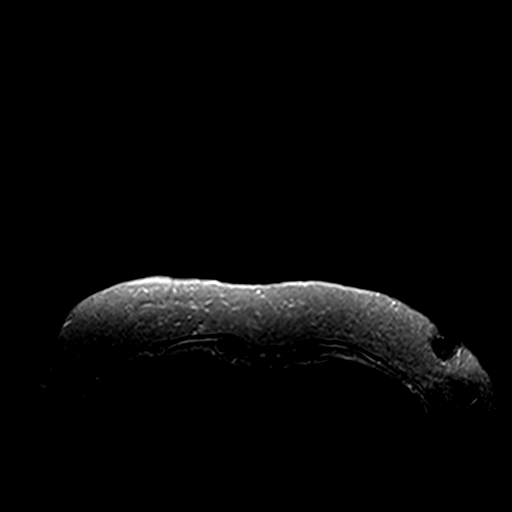

[Series 3: fl3d pre-cm no · axial · non-contrast · 1.2mm · 0.94mm/px · z∈[-66,+106]mm · 5 of 144 slices shown]
[im 1/144]
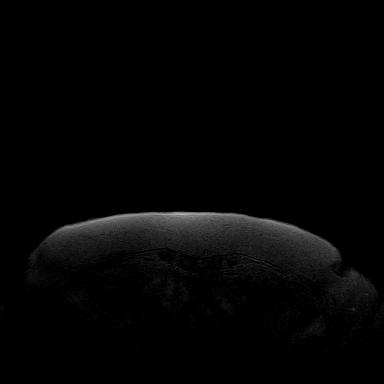
[im 36/144]
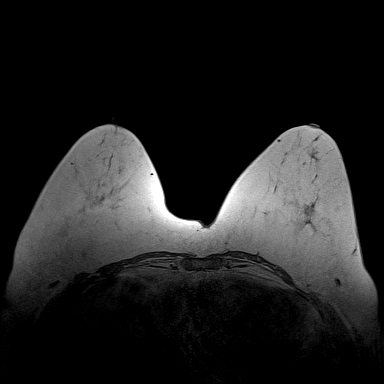
[im 72/144]
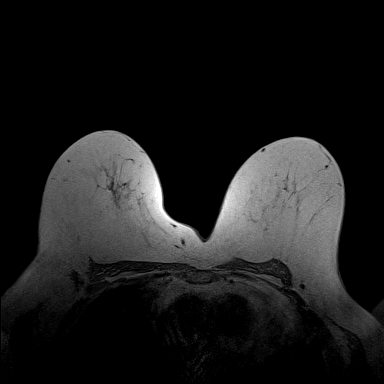
[im 108/144]
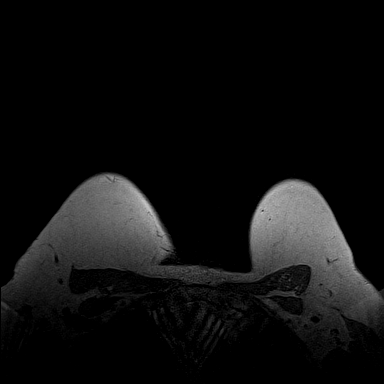
[im 144/144]
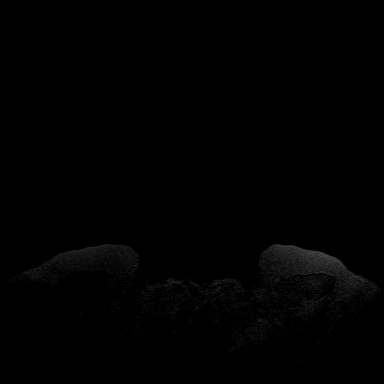

[Series 4: fl3d pre-cm · axial · non-contrast · 1.2mm · 0.94mm/px · z∈[-66,+106]mm · 5 of 144 slices shown]
[im 1/144]
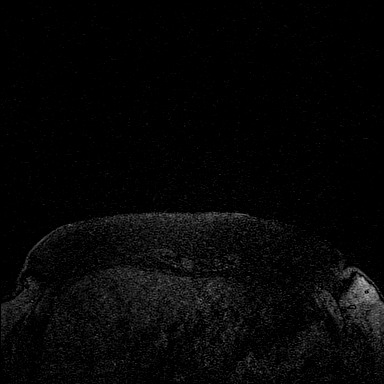
[im 36/144]
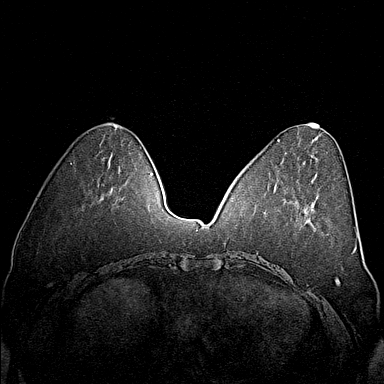
[im 72/144]
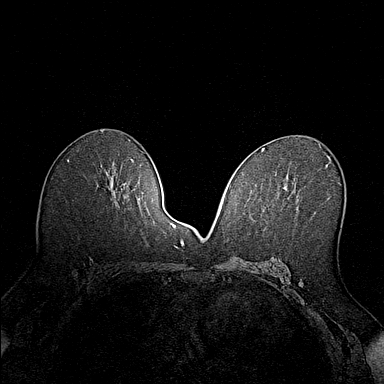
[im 108/144]
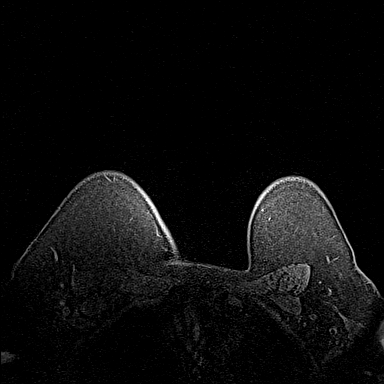
[im 144/144]
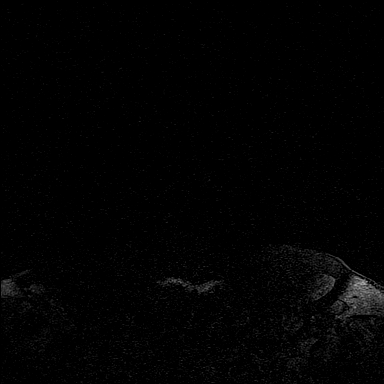

[Series 5: fl3d post-cm 20 · axial · 1.2mm · 0.94mm/px · z∈[-66,+106]mm · 5 of 144 slices shown (1 of 3)]
[im 1/144]
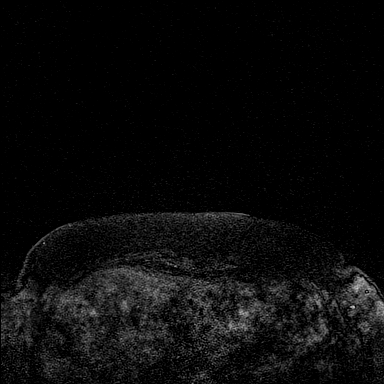
[im 36/144]
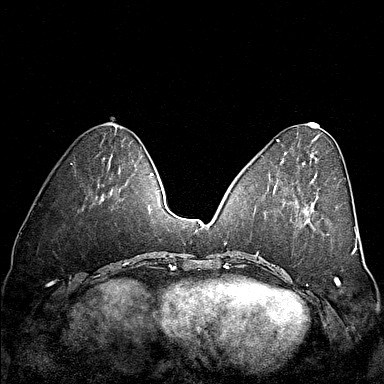
[im 72/144]
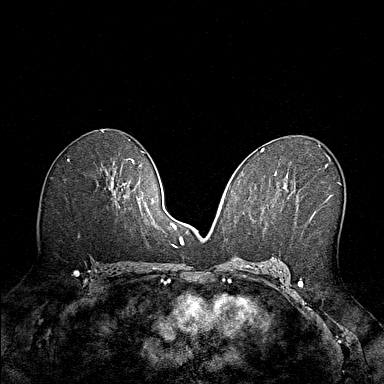
[im 108/144]
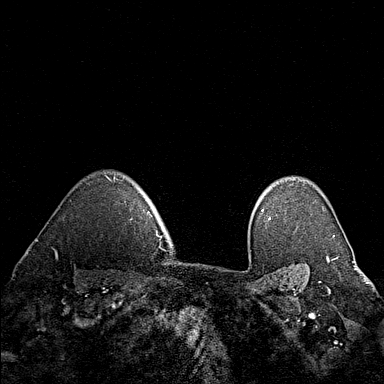
[im 144/144]
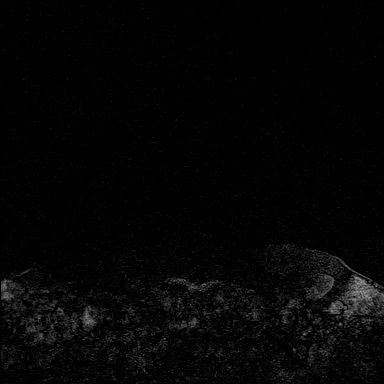

[Series 6: fl3d post-cm 20 · axial · 1.2mm · 0.94mm/px · z∈[-66,+106]mm · 5 of 144 slices shown (2 of 3)]
[im 1/144]
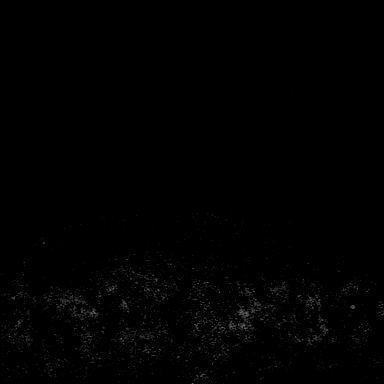
[im 36/144]
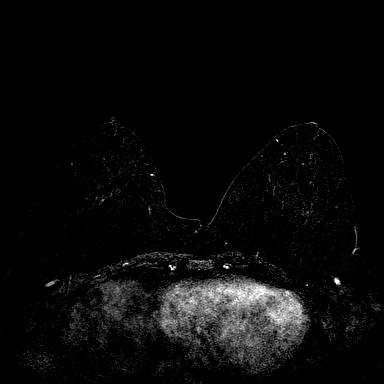
[im 72/144]
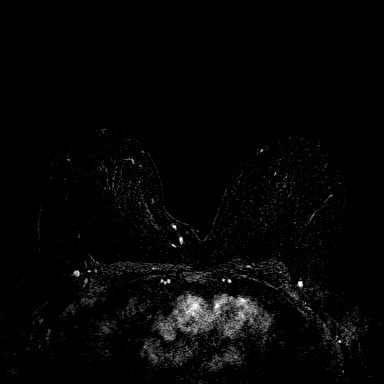
[im 108/144]
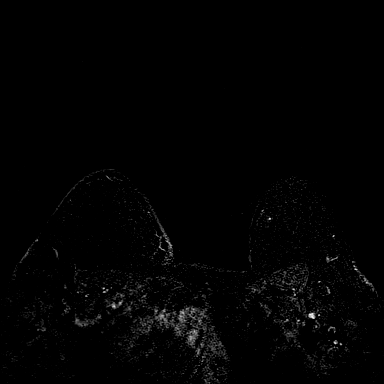
[im 144/144]
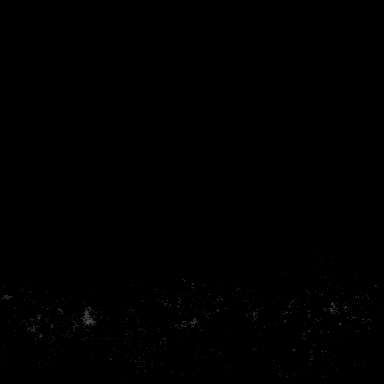

[Series 7: fl3d post-cm 20 · axial · 172.8mm · 0.94mm/px · 1 of 1 slices shown (3 of 3)]
[im 1/1]
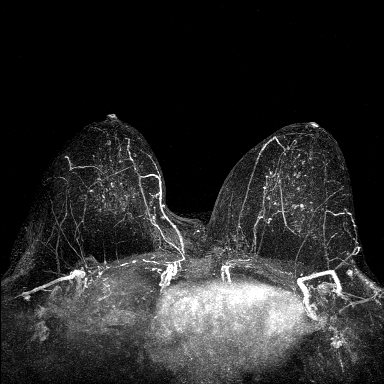

[Series 8: fl3d post-cm 3min · axial · 1.2mm · 0.94mm/px · z∈[-66,+106]mm · 6 of 144 slices shown]
[im 1/144]
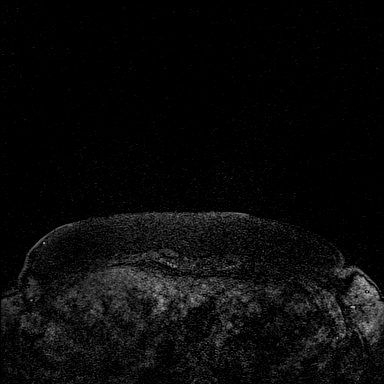
[im 29/144]
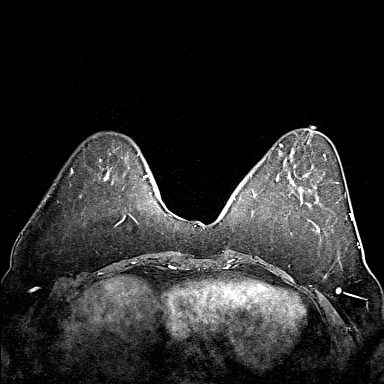
[im 58/144]
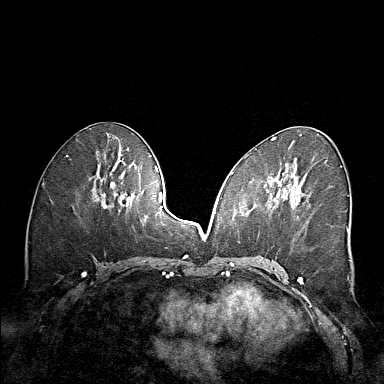
[im 86/144]
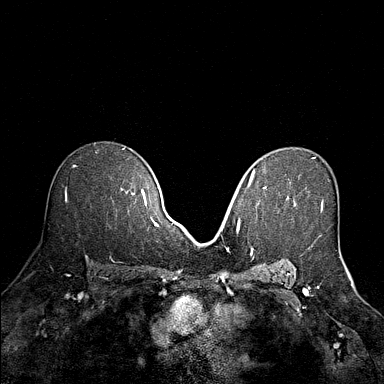
[im 115/144]
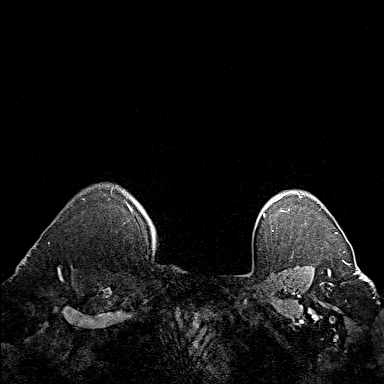
[im 144/144]
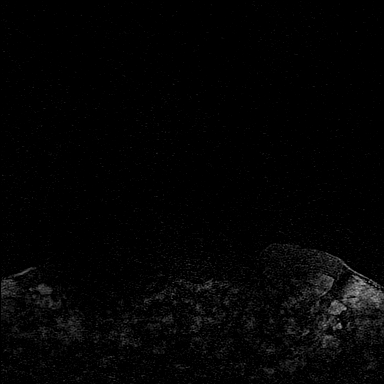

[Series 9: fl3d post-cm 3min_sub · axial · 1.2mm · 0.94mm/px · z∈[-66,+71]mm · 5 of 144 slices shown]
[im 1/144]
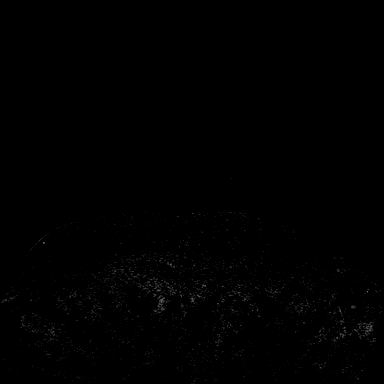
[im 29/144]
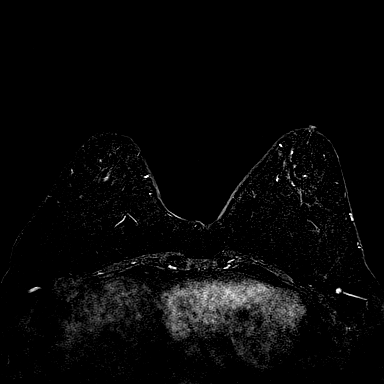
[im 58/144]
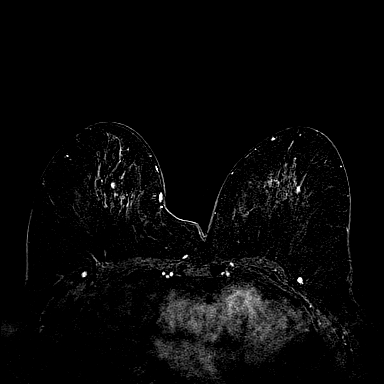
[im 86/144]
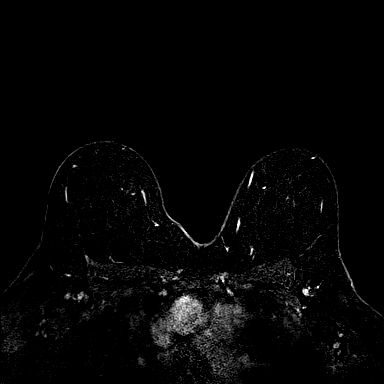
[im 115/144]
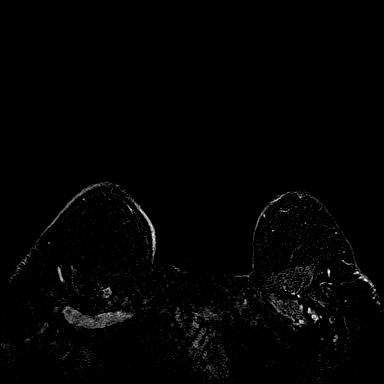

[33 of 48 positions shown; findings below may reference images not displayed]

Three-dimensional MR images were rendered by post-processing of the
original MR data on an independent workstation. The
three-dimensional MR images were interpreted, and findings are
reported in the following complete MRI report for this study. Three
dimensional images were evaluated at the independent interpreting
workstation using the DynaCAD thin client.
FINDINGS: Breast composition: b. Scattered fibroglandular tissue.

Background parenchymal enhancement: Moderate.

Right breast: No suspicious enhancing mass, suspicious non-mass
enhancement or secondary signs of malignancy.

Left breast: No suspicious enhancing mass, suspicious non-mass
enhancement or secondary signs of malignancy.

Lymph nodes: No abnormal appearing lymph nodes.

Ancillary findings:  None.
IMPRESSION: No MRI evidence of malignancy in either breast.

RECOMMENDATION:
1. Annual screening mammograms.
2. Given patient's family history of breast cancer, would consider
the addition of annual screening breast MRI to annual screening
mammography. Per [employer] Society guidelines, annual
screening MRI of the breasts is recommended if a risk assessment
calculation for breast cancer, preferably using the Tyrer-Cuzick or
Gail model, measures greater than 20%.

BI-RADS CATEGORY  1: Negative.

## 2022-05-19 ENCOUNTER — Other Ambulatory Visit: Payer: Self-pay | Admitting: Obstetrics and Gynecology

## 2022-05-19 DIAGNOSIS — Z1231 Encounter for screening mammogram for malignant neoplasm of breast: Secondary | ICD-10-CM

## 2022-06-15 DIAGNOSIS — H02831 Dermatochalasis of right upper eyelid: Secondary | ICD-10-CM | POA: Diagnosis not present

## 2022-06-15 DIAGNOSIS — H5213 Myopia, bilateral: Secondary | ICD-10-CM | POA: Diagnosis not present

## 2022-06-15 DIAGNOSIS — H25813 Combined forms of age-related cataract, bilateral: Secondary | ICD-10-CM | POA: Diagnosis not present

## 2022-06-15 DIAGNOSIS — H40013 Open angle with borderline findings, low risk, bilateral: Secondary | ICD-10-CM | POA: Diagnosis not present

## 2022-06-15 DIAGNOSIS — H02834 Dermatochalasis of left upper eyelid: Secondary | ICD-10-CM | POA: Diagnosis not present

## 2022-06-23 DIAGNOSIS — Z1212 Encounter for screening for malignant neoplasm of rectum: Secondary | ICD-10-CM | POA: Diagnosis not present

## 2022-06-23 DIAGNOSIS — R7989 Other specified abnormal findings of blood chemistry: Secondary | ICD-10-CM | POA: Diagnosis not present

## 2022-06-23 DIAGNOSIS — K219 Gastro-esophageal reflux disease without esophagitis: Secondary | ICD-10-CM | POA: Diagnosis not present

## 2022-06-23 DIAGNOSIS — E785 Hyperlipidemia, unspecified: Secondary | ICD-10-CM | POA: Diagnosis not present

## 2022-06-30 DIAGNOSIS — R82998 Other abnormal findings in urine: Secondary | ICD-10-CM | POA: Diagnosis not present

## 2022-06-30 DIAGNOSIS — E785 Hyperlipidemia, unspecified: Secondary | ICD-10-CM | POA: Diagnosis not present

## 2022-06-30 DIAGNOSIS — Z1331 Encounter for screening for depression: Secondary | ICD-10-CM | POA: Diagnosis not present

## 2022-06-30 DIAGNOSIS — E669 Obesity, unspecified: Secondary | ICD-10-CM | POA: Diagnosis not present

## 2022-06-30 DIAGNOSIS — Z1339 Encounter for screening examination for other mental health and behavioral disorders: Secondary | ICD-10-CM | POA: Diagnosis not present

## 2022-06-30 DIAGNOSIS — Z0000 Encounter for general adult medical examination without abnormal findings: Secondary | ICD-10-CM | POA: Diagnosis not present

## 2022-06-30 DIAGNOSIS — I872 Venous insufficiency (chronic) (peripheral): Secondary | ICD-10-CM | POA: Diagnosis not present

## 2022-06-30 DIAGNOSIS — Z23 Encounter for immunization: Secondary | ICD-10-CM | POA: Diagnosis not present

## 2022-06-30 DIAGNOSIS — F39 Unspecified mood [affective] disorder: Secondary | ICD-10-CM | POA: Diagnosis not present

## 2022-07-06 ENCOUNTER — Ambulatory Visit: Payer: BC Managed Care – PPO

## 2022-07-29 ENCOUNTER — Ambulatory Visit
Admission: RE | Admit: 2022-07-29 | Discharge: 2022-07-29 | Disposition: A | Discharge status: Home / Self Care | Payer: BC Managed Care – PPO | Source: Ambulatory Visit | Attending: Obstetrics and Gynecology | Admitting: Obstetrics and Gynecology

## 2022-07-29 DIAGNOSIS — Z1231 Encounter for screening mammogram for malignant neoplasm of breast: Secondary | ICD-10-CM

## 2022-08-03 ENCOUNTER — Other Ambulatory Visit: Payer: Self-pay | Admitting: Obstetrics and Gynecology

## 2022-08-03 DIAGNOSIS — R928 Other abnormal and inconclusive findings on diagnostic imaging of breast: Secondary | ICD-10-CM

## 2022-08-14 ENCOUNTER — Ambulatory Visit: Admission: RE | Admit: 2022-08-14 | Payer: BC Managed Care – PPO | Source: Ambulatory Visit

## 2022-08-14 ENCOUNTER — Ambulatory Visit
Admission: RE | Admit: 2022-08-14 | Discharge: 2022-08-14 | Disposition: A | Discharge status: Home / Self Care | Payer: BC Managed Care – PPO | Source: Ambulatory Visit | Attending: Obstetrics and Gynecology | Admitting: Obstetrics and Gynecology

## 2022-08-14 DIAGNOSIS — R92322 Mammographic fibroglandular density, left breast: Secondary | ICD-10-CM | POA: Diagnosis not present

## 2022-08-14 DIAGNOSIS — R928 Other abnormal and inconclusive findings on diagnostic imaging of breast: Secondary | ICD-10-CM

## 2022-08-18 DIAGNOSIS — L821 Other seborrheic keratosis: Secondary | ICD-10-CM | POA: Diagnosis not present

## 2022-08-18 DIAGNOSIS — D225 Melanocytic nevi of trunk: Secondary | ICD-10-CM | POA: Diagnosis not present

## 2022-08-18 DIAGNOSIS — L814 Other melanin hyperpigmentation: Secondary | ICD-10-CM | POA: Diagnosis not present

## 2022-08-24 DIAGNOSIS — M1990 Unspecified osteoarthritis, unspecified site: Secondary | ICD-10-CM | POA: Diagnosis not present

## 2022-08-24 DIAGNOSIS — K219 Gastro-esophageal reflux disease without esophagitis: Secondary | ICD-10-CM | POA: Diagnosis not present

## 2022-08-24 DIAGNOSIS — E669 Obesity, unspecified: Secondary | ICD-10-CM | POA: Diagnosis not present

## 2022-08-24 DIAGNOSIS — M8580 Other specified disorders of bone density and structure, unspecified site: Secondary | ICD-10-CM | POA: Diagnosis not present

## 2022-08-24 DIAGNOSIS — E785 Hyperlipidemia, unspecified: Secondary | ICD-10-CM | POA: Diagnosis not present

## 2022-09-04 DIAGNOSIS — N390 Urinary tract infection, site not specified: Secondary | ICD-10-CM | POA: Diagnosis not present

## 2022-09-21 DIAGNOSIS — Z6830 Body mass index (BMI) 30.0-30.9, adult: Secondary | ICD-10-CM | POA: Diagnosis not present

## 2022-09-21 DIAGNOSIS — Z01419 Encounter for gynecological examination (general) (routine) without abnormal findings: Secondary | ICD-10-CM | POA: Diagnosis not present

## 2022-10-28 ENCOUNTER — Other Ambulatory Visit (HOSPITAL_BASED_OUTPATIENT_CLINIC_OR_DEPARTMENT_OTHER): Payer: Self-pay

## 2022-10-28 MED ORDER — SEMAGLUTIDE-WEIGHT MANAGEMENT 1 MG/0.5ML ~~LOC~~ SOAJ: 1.0000 mg | SUBCUTANEOUS | 3 refills | Status: AC

## 2022-10-28 MED ORDER — WEGOVY 1 MG/0.5ML ~~LOC~~ SOAJ
1.0000 mg | SUBCUTANEOUS | 3 refills | Status: AC
  Filled 2022-10-28: qty 2, 28d supply, fill #0

## 2022-11-23 ENCOUNTER — Other Ambulatory Visit (HOSPITAL_BASED_OUTPATIENT_CLINIC_OR_DEPARTMENT_OTHER): Payer: Self-pay

## 2022-11-23 ENCOUNTER — Other Ambulatory Visit (HOSPITAL_COMMUNITY): Payer: Self-pay

## 2022-11-23 ENCOUNTER — Other Ambulatory Visit: Payer: Self-pay

## 2022-11-23 DIAGNOSIS — M1990 Unspecified osteoarthritis, unspecified site: Secondary | ICD-10-CM | POA: Diagnosis not present

## 2022-11-23 MED ORDER — MELOXICAM 15 MG PO TABS
15.0000 mg | ORAL_TABLET | Freq: Every day | ORAL | 1 refills | Status: AC
  Filled 2022-11-23: qty 30, 30d supply, fill #0
  Filled 2022-12-16: qty 30, 30d supply, fill #1
  Filled 2023-01-13 – 2023-02-18 (×5): qty 30, 30d supply, fill #2

## 2022-11-23 MED ORDER — WEGOVY 1.7 MG/0.75ML ~~LOC~~ SOAJ
0.7500 mL | SUBCUTANEOUS | 3 refills | Status: AC
  Filled 2022-11-23: qty 3, 28d supply, fill #0
  Filled 2022-12-16: qty 3, 28d supply, fill #1
  Filled 2023-01-13: qty 3, 28d supply, fill #2
  Filled 2023-02-12 – 2023-02-18 (×2): qty 3, 28d supply, fill #3

## 2022-11-24 ENCOUNTER — Other Ambulatory Visit (HOSPITAL_BASED_OUTPATIENT_CLINIC_OR_DEPARTMENT_OTHER): Payer: Self-pay

## 2023-01-08 DIAGNOSIS — M7061 Trochanteric bursitis, right hip: Secondary | ICD-10-CM | POA: Diagnosis not present

## 2023-01-08 DIAGNOSIS — M25562 Pain in left knee: Secondary | ICD-10-CM | POA: Diagnosis not present

## 2023-01-13 ENCOUNTER — Other Ambulatory Visit (HOSPITAL_BASED_OUTPATIENT_CLINIC_OR_DEPARTMENT_OTHER): Payer: Self-pay

## 2023-01-13 ENCOUNTER — Other Ambulatory Visit: Payer: Self-pay

## 2023-01-14 ENCOUNTER — Other Ambulatory Visit (HOSPITAL_BASED_OUTPATIENT_CLINIC_OR_DEPARTMENT_OTHER): Payer: Self-pay

## 2023-01-15 ENCOUNTER — Other Ambulatory Visit (HOSPITAL_BASED_OUTPATIENT_CLINIC_OR_DEPARTMENT_OTHER): Payer: Self-pay

## 2023-02-12 ENCOUNTER — Other Ambulatory Visit (HOSPITAL_BASED_OUTPATIENT_CLINIC_OR_DEPARTMENT_OTHER): Payer: Self-pay

## 2023-02-16 ENCOUNTER — Other Ambulatory Visit (HOSPITAL_BASED_OUTPATIENT_CLINIC_OR_DEPARTMENT_OTHER): Payer: Self-pay

## 2023-02-17 ENCOUNTER — Other Ambulatory Visit (HOSPITAL_BASED_OUTPATIENT_CLINIC_OR_DEPARTMENT_OTHER): Payer: Self-pay

## 2023-02-17 MED ORDER — WEGOVY 1.7 MG/0.75ML ~~LOC~~ SOAJ
1.7000 mg | SUBCUTANEOUS | 1 refills | Status: AC
  Filled 2023-02-17 – 2023-03-15 (×3): qty 3, 28d supply, fill #0

## 2023-02-18 ENCOUNTER — Other Ambulatory Visit (HOSPITAL_BASED_OUTPATIENT_CLINIC_OR_DEPARTMENT_OTHER): Payer: Self-pay

## 2023-02-18 ENCOUNTER — Other Ambulatory Visit (HOSPITAL_COMMUNITY): Payer: Self-pay

## 2023-02-22 DIAGNOSIS — M1990 Unspecified osteoarthritis, unspecified site: Secondary | ICD-10-CM | POA: Diagnosis not present

## 2023-02-22 DIAGNOSIS — E785 Hyperlipidemia, unspecified: Secondary | ICD-10-CM | POA: Diagnosis not present

## 2023-03-15 ENCOUNTER — Other Ambulatory Visit (HOSPITAL_BASED_OUTPATIENT_CLINIC_OR_DEPARTMENT_OTHER): Payer: Self-pay

## 2023-04-13 DIAGNOSIS — M25562 Pain in left knee: Secondary | ICD-10-CM | POA: Diagnosis not present

## 2023-04-30 ENCOUNTER — Other Ambulatory Visit (HOSPITAL_BASED_OUTPATIENT_CLINIC_OR_DEPARTMENT_OTHER): Payer: Self-pay

## 2023-04-30 ENCOUNTER — Other Ambulatory Visit: Payer: Self-pay

## 2023-04-30 MED ORDER — TRAMADOL HCL 50 MG PO TABS
50.0000 mg | ORAL_TABLET | Freq: Four times a day (QID) | ORAL | 0 refills | Status: DC | PRN
  Filled 2023-04-30: qty 40, 5d supply, fill #0

## 2023-04-30 MED ORDER — GABAPENTIN 300 MG PO CAPS
ORAL_CAPSULE | ORAL | 0 refills | Status: AC
  Filled 2023-04-30: qty 84, 42d supply, fill #0

## 2023-04-30 MED ORDER — OXYCODONE HCL 5 MG PO TABS
5.0000 mg | ORAL_TABLET | Freq: Four times a day (QID) | ORAL | 0 refills | Status: AC | PRN
  Filled 2023-04-30: qty 42, 6d supply, fill #0

## 2023-04-30 MED ORDER — ASPIRIN 81 MG PO TBEC
DELAYED_RELEASE_TABLET | ORAL | 0 refills | Status: AC
  Filled 2023-04-30: qty 63, 42d supply, fill #0

## 2023-04-30 MED ORDER — ONDANSETRON HCL 4 MG PO TABS
4.0000 mg | ORAL_TABLET | Freq: Four times a day (QID) | ORAL | 0 refills | Status: AC | PRN
  Filled 2023-04-30: qty 20, 5d supply, fill #0

## 2023-04-30 MED ORDER — METHOCARBAMOL 500 MG PO TABS
500.0000 mg | ORAL_TABLET | Freq: Four times a day (QID) | ORAL | 0 refills | Status: AC | PRN
  Filled 2023-04-30: qty 40, 10d supply, fill #0

## 2023-05-04 ENCOUNTER — Other Ambulatory Visit (HOSPITAL_BASED_OUTPATIENT_CLINIC_OR_DEPARTMENT_OTHER): Payer: Self-pay

## 2023-05-04 DIAGNOSIS — M25762 Osteophyte, left knee: Secondary | ICD-10-CM | POA: Diagnosis not present

## 2023-05-04 DIAGNOSIS — G8918 Other acute postprocedural pain: Secondary | ICD-10-CM | POA: Diagnosis not present

## 2023-05-04 DIAGNOSIS — M1712 Unilateral primary osteoarthritis, left knee: Secondary | ICD-10-CM | POA: Diagnosis not present

## 2023-05-06 ENCOUNTER — Other Ambulatory Visit (HOSPITAL_BASED_OUTPATIENT_CLINIC_OR_DEPARTMENT_OTHER): Payer: Self-pay

## 2023-05-06 DIAGNOSIS — R269 Unspecified abnormalities of gait and mobility: Secondary | ICD-10-CM | POA: Diagnosis not present

## 2023-05-06 DIAGNOSIS — M25662 Stiffness of left knee, not elsewhere classified: Secondary | ICD-10-CM | POA: Diagnosis not present

## 2023-05-06 DIAGNOSIS — M25562 Pain in left knee: Secondary | ICD-10-CM | POA: Diagnosis not present

## 2023-05-10 DIAGNOSIS — M25562 Pain in left knee: Secondary | ICD-10-CM | POA: Diagnosis not present

## 2023-05-10 DIAGNOSIS — R2689 Other abnormalities of gait and mobility: Secondary | ICD-10-CM | POA: Diagnosis not present

## 2023-05-10 DIAGNOSIS — M25662 Stiffness of left knee, not elsewhere classified: Secondary | ICD-10-CM | POA: Diagnosis not present

## 2023-05-12 DIAGNOSIS — M25662 Stiffness of left knee, not elsewhere classified: Secondary | ICD-10-CM | POA: Diagnosis not present

## 2023-05-12 DIAGNOSIS — M25562 Pain in left knee: Secondary | ICD-10-CM | POA: Diagnosis not present

## 2023-05-12 DIAGNOSIS — R2689 Other abnormalities of gait and mobility: Secondary | ICD-10-CM | POA: Diagnosis not present

## 2023-05-17 DIAGNOSIS — M25662 Stiffness of left knee, not elsewhere classified: Secondary | ICD-10-CM | POA: Diagnosis not present

## 2023-05-17 DIAGNOSIS — R2689 Other abnormalities of gait and mobility: Secondary | ICD-10-CM | POA: Diagnosis not present

## 2023-05-17 DIAGNOSIS — M25562 Pain in left knee: Secondary | ICD-10-CM | POA: Diagnosis not present

## 2023-05-19 ENCOUNTER — Other Ambulatory Visit (HOSPITAL_BASED_OUTPATIENT_CLINIC_OR_DEPARTMENT_OTHER): Payer: Self-pay

## 2023-05-19 MED ORDER — TRAMADOL HCL 50 MG PO TABS
50.0000 mg | ORAL_TABLET | Freq: Four times a day (QID) | ORAL | 0 refills | Status: AC | PRN
  Filled 2023-05-19: qty 40, 5d supply, fill #0

## 2023-05-20 DIAGNOSIS — M25562 Pain in left knee: Secondary | ICD-10-CM | POA: Diagnosis not present

## 2023-05-20 DIAGNOSIS — R2689 Other abnormalities of gait and mobility: Secondary | ICD-10-CM | POA: Diagnosis not present

## 2023-05-20 DIAGNOSIS — M25662 Stiffness of left knee, not elsewhere classified: Secondary | ICD-10-CM | POA: Diagnosis not present

## 2023-05-24 DIAGNOSIS — M25662 Stiffness of left knee, not elsewhere classified: Secondary | ICD-10-CM | POA: Diagnosis not present

## 2023-05-24 DIAGNOSIS — R2689 Other abnormalities of gait and mobility: Secondary | ICD-10-CM | POA: Diagnosis not present

## 2023-05-24 DIAGNOSIS — M25562 Pain in left knee: Secondary | ICD-10-CM | POA: Diagnosis not present

## 2023-05-27 DIAGNOSIS — M25662 Stiffness of left knee, not elsewhere classified: Secondary | ICD-10-CM | POA: Diagnosis not present

## 2023-05-27 DIAGNOSIS — M25562 Pain in left knee: Secondary | ICD-10-CM | POA: Diagnosis not present

## 2023-05-27 DIAGNOSIS — R2689 Other abnormalities of gait and mobility: Secondary | ICD-10-CM | POA: Diagnosis not present

## 2023-05-31 DIAGNOSIS — M25562 Pain in left knee: Secondary | ICD-10-CM | POA: Diagnosis not present

## 2023-05-31 DIAGNOSIS — R2689 Other abnormalities of gait and mobility: Secondary | ICD-10-CM | POA: Diagnosis not present

## 2023-05-31 DIAGNOSIS — M25662 Stiffness of left knee, not elsewhere classified: Secondary | ICD-10-CM | POA: Diagnosis not present

## 2023-06-03 DIAGNOSIS — R2689 Other abnormalities of gait and mobility: Secondary | ICD-10-CM | POA: Diagnosis not present

## 2023-06-03 DIAGNOSIS — M25562 Pain in left knee: Secondary | ICD-10-CM | POA: Diagnosis not present

## 2023-06-03 DIAGNOSIS — M25662 Stiffness of left knee, not elsewhere classified: Secondary | ICD-10-CM | POA: Diagnosis not present

## 2023-06-07 DIAGNOSIS — R269 Unspecified abnormalities of gait and mobility: Secondary | ICD-10-CM | POA: Diagnosis not present

## 2023-06-07 DIAGNOSIS — M25562 Pain in left knee: Secondary | ICD-10-CM | POA: Diagnosis not present

## 2023-06-07 DIAGNOSIS — M25662 Stiffness of left knee, not elsewhere classified: Secondary | ICD-10-CM | POA: Diagnosis not present

## 2023-06-08 DIAGNOSIS — Z5189 Encounter for other specified aftercare: Secondary | ICD-10-CM | POA: Diagnosis not present

## 2023-06-10 DIAGNOSIS — M25662 Stiffness of left knee, not elsewhere classified: Secondary | ICD-10-CM | POA: Diagnosis not present

## 2023-06-10 DIAGNOSIS — R269 Unspecified abnormalities of gait and mobility: Secondary | ICD-10-CM | POA: Diagnosis not present

## 2023-06-10 DIAGNOSIS — M25562 Pain in left knee: Secondary | ICD-10-CM | POA: Diagnosis not present

## 2023-06-14 DIAGNOSIS — R269 Unspecified abnormalities of gait and mobility: Secondary | ICD-10-CM | POA: Diagnosis not present

## 2023-06-14 DIAGNOSIS — M25562 Pain in left knee: Secondary | ICD-10-CM | POA: Diagnosis not present

## 2023-06-14 DIAGNOSIS — M25662 Stiffness of left knee, not elsewhere classified: Secondary | ICD-10-CM | POA: Diagnosis not present

## 2023-06-17 DIAGNOSIS — R2689 Other abnormalities of gait and mobility: Secondary | ICD-10-CM | POA: Diagnosis not present

## 2023-06-17 DIAGNOSIS — M25562 Pain in left knee: Secondary | ICD-10-CM | POA: Diagnosis not present

## 2023-06-17 DIAGNOSIS — M25662 Stiffness of left knee, not elsewhere classified: Secondary | ICD-10-CM | POA: Diagnosis not present

## 2023-06-21 ENCOUNTER — Other Ambulatory Visit: Payer: Self-pay | Admitting: Obstetrics and Gynecology

## 2023-06-21 DIAGNOSIS — R269 Unspecified abnormalities of gait and mobility: Secondary | ICD-10-CM | POA: Diagnosis not present

## 2023-06-21 DIAGNOSIS — M25662 Stiffness of left knee, not elsewhere classified: Secondary | ICD-10-CM | POA: Diagnosis not present

## 2023-06-21 DIAGNOSIS — Z1231 Encounter for screening mammogram for malignant neoplasm of breast: Secondary | ICD-10-CM

## 2023-06-21 DIAGNOSIS — M25562 Pain in left knee: Secondary | ICD-10-CM | POA: Diagnosis not present

## 2023-06-24 DIAGNOSIS — R269 Unspecified abnormalities of gait and mobility: Secondary | ICD-10-CM | POA: Diagnosis not present

## 2023-06-24 DIAGNOSIS — M25562 Pain in left knee: Secondary | ICD-10-CM | POA: Diagnosis not present

## 2023-06-24 DIAGNOSIS — M25662 Stiffness of left knee, not elsewhere classified: Secondary | ICD-10-CM | POA: Diagnosis not present

## 2023-07-05 DIAGNOSIS — M25562 Pain in left knee: Secondary | ICD-10-CM | POA: Diagnosis not present

## 2023-07-05 DIAGNOSIS — M25662 Stiffness of left knee, not elsewhere classified: Secondary | ICD-10-CM | POA: Diagnosis not present

## 2023-07-05 DIAGNOSIS — R269 Unspecified abnormalities of gait and mobility: Secondary | ICD-10-CM | POA: Diagnosis not present

## 2023-07-06 DIAGNOSIS — Z79899 Other long term (current) drug therapy: Secondary | ICD-10-CM | POA: Diagnosis not present

## 2023-07-06 DIAGNOSIS — M8580 Other specified disorders of bone density and structure, unspecified site: Secondary | ICD-10-CM | POA: Diagnosis not present

## 2023-07-06 DIAGNOSIS — E785 Hyperlipidemia, unspecified: Secondary | ICD-10-CM | POA: Diagnosis not present

## 2023-07-12 DIAGNOSIS — Z1212 Encounter for screening for malignant neoplasm of rectum: Secondary | ICD-10-CM | POA: Diagnosis not present

## 2023-07-13 DIAGNOSIS — R82998 Other abnormal findings in urine: Secondary | ICD-10-CM | POA: Diagnosis not present

## 2023-07-13 DIAGNOSIS — K219 Gastro-esophageal reflux disease without esophagitis: Secondary | ICD-10-CM | POA: Diagnosis not present

## 2023-07-13 DIAGNOSIS — E785 Hyperlipidemia, unspecified: Secondary | ICD-10-CM | POA: Diagnosis not present

## 2023-07-13 DIAGNOSIS — M1990 Unspecified osteoarthritis, unspecified site: Secondary | ICD-10-CM | POA: Diagnosis not present

## 2023-07-13 DIAGNOSIS — F39 Unspecified mood [affective] disorder: Secondary | ICD-10-CM | POA: Diagnosis not present

## 2023-07-13 DIAGNOSIS — I872 Venous insufficiency (chronic) (peripheral): Secondary | ICD-10-CM | POA: Diagnosis not present

## 2023-07-13 DIAGNOSIS — E669 Obesity, unspecified: Secondary | ICD-10-CM | POA: Diagnosis not present

## 2023-07-13 DIAGNOSIS — M8580 Other specified disorders of bone density and structure, unspecified site: Secondary | ICD-10-CM | POA: Diagnosis not present

## 2023-07-13 DIAGNOSIS — Z0000 Encounter for general adult medical examination without abnormal findings: Secondary | ICD-10-CM | POA: Diagnosis not present

## 2023-07-26 DIAGNOSIS — H5213 Myopia, bilateral: Secondary | ICD-10-CM | POA: Diagnosis not present

## 2023-07-26 DIAGNOSIS — H25013 Cortical age-related cataract, bilateral: Secondary | ICD-10-CM | POA: Diagnosis not present

## 2023-07-26 DIAGNOSIS — H40023 Open angle with borderline findings, high risk, bilateral: Secondary | ICD-10-CM | POA: Diagnosis not present

## 2023-07-30 ENCOUNTER — Ambulatory Visit
Admission: RE | Admit: 2023-07-30 | Discharge: 2023-07-30 | Disposition: A | Discharge status: Home / Self Care | Source: Ambulatory Visit | Attending: Obstetrics and Gynecology | Admitting: Obstetrics and Gynecology

## 2023-07-30 DIAGNOSIS — Z1231 Encounter for screening mammogram for malignant neoplasm of breast: Secondary | ICD-10-CM

## 2023-09-22 DIAGNOSIS — Z01419 Encounter for gynecological examination (general) (routine) without abnormal findings: Secondary | ICD-10-CM | POA: Diagnosis not present

## 2023-09-22 DIAGNOSIS — Z6830 Body mass index (BMI) 30.0-30.9, adult: Secondary | ICD-10-CM | POA: Diagnosis not present

## 2024-04-07 ENCOUNTER — Other Ambulatory Visit (HOSPITAL_BASED_OUTPATIENT_CLINIC_OR_DEPARTMENT_OTHER): Payer: Self-pay

## 2024-04-07 MED ORDER — PREDNISONE 5 MG (21) PO TBPK
ORAL_TABLET | ORAL | 0 refills | Status: AC
  Filled 2024-04-07: qty 21, 6d supply, fill #0

## 2024-04-07 MED ORDER — AMOXICILLIN-POT CLAVULANATE 875-125 MG PO TABS
1.0000 | ORAL_TABLET | Freq: Two times a day (BID) | ORAL | 0 refills | Status: AC
  Filled 2024-04-07: qty 20, 10d supply, fill #0
